# Patient Record
Sex: Female | Born: 1985 | Race: Black or African American | Hispanic: No | Marital: Single | Smoking: Former smoker
Health system: Southern US, Community
[De-identification: ages and names within clinical notes are randomized; demographics above are authoritative.]

## PROBLEM LIST (undated history)

## (undated) ENCOUNTER — Inpatient Hospital Stay (HOSPITAL_COMMUNITY): Payer: Self-pay

## (undated) DIAGNOSIS — B999 Unspecified infectious disease: Secondary | ICD-10-CM

## (undated) DIAGNOSIS — Z789 Other specified health status: Secondary | ICD-10-CM

## (undated) DIAGNOSIS — IMO0001 Reserved for inherently not codable concepts without codable children: Secondary | ICD-10-CM

## (undated) HISTORY — PX: NO PAST SURGERIES: SHX2092

---
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
## 2009-01-01 ENCOUNTER — Emergency Department (HOSPITAL_COMMUNITY): Admission: EM | Admit: 2009-01-01 | Discharge: 2009-01-01 | Payer: Self-pay | Admitting: Emergency Medicine

## 2010-01-06 ENCOUNTER — Inpatient Hospital Stay (HOSPITAL_COMMUNITY)
Admission: AD | Admit: 2010-01-06 | Discharge: 2010-01-06 | Payer: Self-pay | Source: Home / Self Care | Admitting: Obstetrics and Gynecology

## 2010-04-25 LAB — CBC
Hemoglobin: 11.5 g/dL — ABNORMAL LOW (ref 12.0–15.0)
MCHC: 33 g/dL (ref 30.0–36.0)
Platelets: 223 10*3/uL (ref 150–400)
RBC: 4.09 MIL/uL (ref 3.87–5.11)
RDW: 13.5 % (ref 11.5–15.5)

## 2010-04-25 LAB — OVA AND PARASITE EXAMINATION: Ova and parasites: NONE SEEN

## 2010-04-25 LAB — CLOSTRIDIUM DIFFICILE EIA: C difficile Toxins A+B, EIA: NEGATIVE

## 2010-04-25 LAB — COMPREHENSIVE METABOLIC PANEL
AST: 19 U/L (ref 0–37)
Alkaline Phosphatase: 39 U/L (ref 39–117)
CO2: 24 mEq/L (ref 19–32)
Chloride: 105 mEq/L (ref 96–112)
Creatinine, Ser: 0.48 mg/dL (ref 0.4–1.2)
GFR calc Af Amer: >60 mL/min (ref >60)
Potassium: 3.7 mEq/L (ref 3.5–5.1)
Sodium: 136 mEq/L (ref 135–145)

## 2010-04-25 LAB — URINALYSIS, ROUTINE W REFLEX MICROSCOPIC
Glucose, UA: NEGATIVE mg/dL
Ketones, ur: NEGATIVE mg/dL
Nitrite: NEGATIVE
Protein, ur: NEGATIVE mg/dL
pH: 7.5 (ref 5.0–8.0)

## 2010-04-25 LAB — STOOL CULTURE

## 2010-04-25 LAB — URINE MICROSCOPIC-ADD ON

## 2010-04-25 LAB — FECAL LACTOFERRIN, QUANT

## 2010-04-25 LAB — LIPASE, BLOOD: Lipase: 23 U/L (ref 11–59)

## 2010-06-22 ENCOUNTER — Inpatient Hospital Stay (HOSPITAL_COMMUNITY)
Admission: AD | Admit: 2010-06-22 | Discharge: 2010-06-25 | DRG: 775 | Disposition: A | Discharge status: Home / Self Care | Payer: Medicaid Other | Source: Ambulatory Visit | Attending: Obstetrics and Gynecology | Admitting: Obstetrics and Gynecology

## 2010-06-22 ENCOUNTER — Inpatient Hospital Stay (HOSPITAL_COMMUNITY)
Admission: AD | Admit: 2010-06-22 | Discharge: 2010-06-22 | Disposition: A | Discharge status: Home / Self Care | Payer: Medicaid Other | Source: Ambulatory Visit | Attending: Obstetrics and Gynecology | Admitting: Obstetrics and Gynecology

## 2010-06-22 DIAGNOSIS — D649 Anemia, unspecified: Secondary | ICD-10-CM | POA: Diagnosis present

## 2010-06-22 DIAGNOSIS — O479 False labor, unspecified: Secondary | ICD-10-CM | POA: Insufficient documentation

## 2010-06-22 DIAGNOSIS — O9902 Anemia complicating childbirth: Principal | ICD-10-CM | POA: Diagnosis present

## 2010-06-22 LAB — CBC
HCT: 39.9 % (ref 36.0–46.0)
Hemoglobin: 13.1 g/dL (ref 12.0–15.0)
MCH: 27.8 pg (ref 26.0–34.0)
MCV: 84.7 fL (ref 78.0–100.0)

## 2010-06-23 ENCOUNTER — Other Ambulatory Visit: Payer: Self-pay

## 2010-06-23 LAB — RPR: RPR Ser Ql: NONREACTIVE

## 2010-06-24 LAB — CBC
HCT: 32.1 % — ABNORMAL LOW (ref 36.0–46.0)
Hemoglobin: 10.2 g/dL — ABNORMAL LOW (ref 12.0–15.0)
MCH: 27.1 pg (ref 26.0–34.0)
RBC: 3.77 MIL/uL — ABNORMAL LOW (ref 3.87–5.11)
WBC: 11 10*3/uL — ABNORMAL HIGH (ref 4.0–10.5)

## 2011-05-12 ENCOUNTER — Encounter (HOSPITAL_COMMUNITY): Payer: Self-pay | Admitting: Emergency Medicine

## 2011-05-12 ENCOUNTER — Emergency Department (HOSPITAL_COMMUNITY): Payer: Self-pay

## 2011-05-12 ENCOUNTER — Emergency Department (HOSPITAL_COMMUNITY)
Admission: EM | Admit: 2011-05-12 | Discharge: 2011-05-12 | Disposition: A | Discharge status: Home / Self Care | Payer: Self-pay | Attending: Emergency Medicine | Admitting: Emergency Medicine

## 2011-05-12 ENCOUNTER — Other Ambulatory Visit: Payer: Self-pay

## 2011-05-12 DIAGNOSIS — R5381 Other malaise: Secondary | ICD-10-CM | POA: Insufficient documentation

## 2011-05-12 DIAGNOSIS — R079 Chest pain, unspecified: Secondary | ICD-10-CM | POA: Insufficient documentation

## 2011-05-12 DIAGNOSIS — R55 Syncope and collapse: Secondary | ICD-10-CM | POA: Insufficient documentation

## 2011-05-12 DIAGNOSIS — R5383 Other fatigue: Secondary | ICD-10-CM | POA: Insufficient documentation

## 2011-05-12 DIAGNOSIS — R059 Cough, unspecified: Secondary | ICD-10-CM | POA: Insufficient documentation

## 2011-05-12 DIAGNOSIS — F17200 Nicotine dependence, unspecified, uncomplicated: Secondary | ICD-10-CM | POA: Insufficient documentation

## 2011-05-12 DIAGNOSIS — R05 Cough: Secondary | ICD-10-CM | POA: Insufficient documentation

## 2011-05-12 DIAGNOSIS — J3489 Other specified disorders of nose and nasal sinuses: Secondary | ICD-10-CM | POA: Insufficient documentation

## 2011-05-12 HISTORY — DX: Reserved for inherently not codable concepts without codable children: IMO0001

## 2011-05-12 LAB — BASIC METABOLIC PANEL
BUN: 7 mg/dL (ref 6–23)
CO2: 28 mEq/L (ref 19–32)
Calcium: 9.1 mg/dL (ref 8.4–10.5)
Chloride: 103 mEq/L (ref 96–112)
Creatinine, Ser: 0.71 mg/dL (ref 0.50–1.10)
GFR calc Af Amer: >90 mL/min (ref >90)
GFR calc non Af Amer: >90 mL/min (ref >90)
Glucose, Bld: 116 mg/dL — ABNORMAL HIGH (ref 70–99)
Potassium: 3.8 mEq/L (ref 3.5–5.1)
Sodium: 139 mEq/L (ref 135–145)

## 2011-05-12 LAB — POCT I-STAT, CHEM 8
BUN: 6 mg/dL (ref 6–23)
Chloride: 103 mEq/L (ref 96–112)
Creatinine, Ser: 0.8 mg/dL (ref 0.50–1.10)
Hemoglobin: 12.9 g/dL (ref 12.0–15.0)
Potassium: 3.8 mEq/L (ref 3.5–5.1)
TCO2: 27 mmol/L (ref 0–100)

## 2011-05-12 LAB — CBC
HCT: 35.7 % — ABNORMAL LOW (ref 36.0–46.0)
MCHC: 32.5 g/dL (ref 30.0–36.0)
Platelets: 241 10*3/uL (ref 150–400)
RBC: 4.26 MIL/uL (ref 3.87–5.11)
WBC: 7.3 10*3/uL (ref 4.0–10.5)

## 2011-05-12 LAB — POCT I-STAT TROPONIN I: Troponin i, poc: 0.01 ng/mL (ref 0.00–0.08)

## 2011-05-12 NOTE — Discharge Instructions (Signed)
Chest Pain (Nonspecific) It is often hard to give a specific diagnosis for the cause of chest pain. There is always a chance that your pain could be related to something serious, such as a heart attack or a blood clot in the lungs. You need to follow up with your caregiver for further evaluation. CAUSES   Heartburn.   Pneumonia or bronchitis.   Anxiety or stress.   Inflammation around your heart (pericarditis) or lung (pleuritis or pleurisy).   A blood clot in the lung.   A collapsed lung (pneumothorax). It can develop suddenly on its own (spontaneous pneumothorax) or from injury (trauma) to the chest.   Shingles infection (herpes zoster virus).  The chest wall is composed of bones, muscles, and cartilage. Any of these can be the source of the pain.  The bones can be bruised by injury.   The muscles or cartilage can be strained by coughing or overwork.   The cartilage can be affected by inflammation and become sore (costochondritis).  DIAGNOSIS  Lab tests or other studies, such as X-rays, electrocardiography, stress testing, or cardiac imaging, may be needed to find the cause of your pain.  TREATMENT   Treatment depends on what may be causing your chest pain. Treatment may include:   Acid blockers for heartburn.   Anti-inflammatory medicine.   Pain medicine for inflammatory conditions.   Antibiotics if an infection is present.   You may be advised to change lifestyle habits. This includes stopping smoking and avoiding alcohol, caffeine, and chocolate.   You may be advised to keep your head raised (elevated) when sleeping. This reduces the chance of acid going backward from your stomach into your esophagus.   Most of the time, nonspecific chest pain will improve within 2 to 3 days with rest and mild pain medicine.  HOME CARE INSTRUCTIONS   If antibiotics were prescribed, take your antibiotics as directed. Finish them even if you start to feel better.   For the next few  days, avoid physical activities that bring on chest pain. Continue physical activities as directed.   Do not smoke.   Avoid drinking alcohol.   Only take over-the-counter or prescription medicine for pain, discomfort, or fever as directed by your caregiver.   Follow your caregiver's suggestions for further testing if your chest pain does not go away.   Keep any follow-up appointments you made. If you do not go to an appointment, you could develop lasting (chronic) problems with pain. If there is any problem keeping an appointment, you must call to reschedule.  SEEK MEDICAL CARE IF:   You think you are having problems from the medicine you are taking. Read your medicine instructions carefully.   Your chest pain does not go away, even after treatment.   You develop a rash with blisters on your chest.  SEEK IMMEDIATE MEDICAL CARE IF:   You have increased chest pain or pain that spreads to your arm, neck, jaw, back, or abdomen.   You develop shortness of breath, an increasing cough, or you are coughing up blood.   You have severe back or abdominal pain, feel nauseous, or vomit.   You develop severe weakness, fainting, or chills.   You have a fever.  THIS IS AN EMERGENCY. Do not wait to see if the pain will go away. Get medical help at once. Call your local emergency services (911 in [place] Do not drive yourself to the hospital. MAKE SURE YOU:   Understand these instructions.  Will watch your condition.   Will get help right away if you are not doing well or get worse.  Document Released: 11/08/2004 Document Revised: 01/18/2011 Document Reviewed: 09/04/2007 [facility] Patient Information 2012 [place], [place].

## 2011-05-12 NOTE — ED Notes (Signed)
EKG given to and signed by EDP. 

## 2011-05-12 NOTE — ED Notes (Addendum)
Patient reporting a "heavy feeling" in her chest and that her "heart feels funny" that started tonight when she was going to bed around 0330.  Location of chest heaviness on left side; rates pain 4/10.  Patient states that the heaviness gets worse when she lies down.  Patient states that she has been sick for the past few days (nasal congestion, runny nose, chills).  EKG done in triage.  Patient reports lightheadedness, but denies shortness of breath, nausea, vomiting, and blurred vision.

## 2011-05-12 NOTE — ED Provider Notes (Signed)
History     CSN: [id number]  Arrival date & time 05/12/11  4098   First MD Initiated Contact with Patient 05/12/11 [phone]      Chief Complaint  Patient presents with  . Chest Pain    (Consider location/radiation/quality/duration/timing/severity/associated sxs/prior treatment) Patient is a 26 y.o. female presenting with chest pain. The history is provided by the patient.  Chest Pain The chest pain began 1 - 2 hours ago. Chest pain occurs intermittently. The chest pain is unchanged. The severity of the pain is mild. The quality of the pain is described as sharp. The pain does not radiate. Pertinent negatives for primary symptoms include no fever, no shortness of breath, no cough, no nausea, no vomiting and no dizziness.  Pertinent negatives for associated symptoms include no diaphoresis and no lower extremity edema. She tried nothing for the symptoms. Risk factors include no known risk factors.  Pertinent negatives for past medical history include no arrhythmia and no CAD.     Past Medical History  Diagnosis Date  . No significant past medical history     History reviewed. No pertinent past surgical history.  History reviewed. No pertinent family history.  History  Substance Use Topics  . Smoking status: Current Everyday Smoker -- 0.5 packs/day  . Smokeless tobacco: Not on file  . Alcohol Use: Yes     Occassional Use    OB History    Grav Para Term Preterm Abortions TAB SAB Ect Mult Living                  Review of Systems  Constitutional: Negative for fever and diaphoresis.  Respiratory: Negative for cough and shortness of breath.   Cardiovascular: Positive for chest pain.  Gastrointestinal: Negative for nausea and vomiting.  Neurological: Negative for dizziness.  All other systems reviewed and are negative.    Allergies  Review of patient's allergies indicates no known allergies.  Home Medications  No current outpatient prescriptions on file.  BP 127/65   Pulse 84  Temp(Src) 98.1 F (36.7 C) (Oral)  Resp 18  SpO2 100%  LMP 04/12/2011  Physical Exam  Nursing note and vitals reviewed. Constitutional: She is oriented to person, place, and time. She appears well-developed and well-nourished.  Non-toxic appearance. No distress.  HENT:  Head: Normocephalic and atraumatic.  Eyes: Conjunctivae, EOM and lids are normal. Pupils are equal, round, and reactive to light.  Neck: Normal range of motion. Neck supple. No tracheal deviation present. No mass present.  Cardiovascular: Normal rate, regular rhythm and normal heart sounds.  Exam reveals no gallop.   No murmur heard. Pulmonary/Chest: Effort normal and breath sounds normal. No stridor. No respiratory distress. She has no decreased breath sounds. She has no wheezes. She has no rhonchi. She has no rales.  Abdominal: Soft. Normal appearance and bowel sounds are normal. She exhibits no distension. There is no tenderness. There is no rebound and no CVA tenderness.  Musculoskeletal: Normal range of motion. She exhibits no edema and no tenderness.  Neurological: She is alert and oriented to person, place, and time. She has normal strength. No cranial nerve deficit or sensory deficit. GCS eye subscore is 4. GCS verbal subscore is 5. GCS motor subscore is 6.  Skin: Skin is warm and dry. No abrasion and no rash noted.  Psychiatric: She has a normal mood and affect. Her speech is normal and behavior is normal.    ED Course  Procedures (including critical care time)  Labs Reviewed  CBC - Abnormal; Notable for the following:    Hemoglobin 11.6 (*)    HCT 35.7 (*)    All other components within normal limits  BASIC METABOLIC PANEL - Abnormal; Notable for the following:    Glucose, Bld 116 (*)    All other components within normal limits  POCT I-STAT, CHEM 8 - Abnormal; Notable for the following:    Glucose, Bld 111 (*)    All other components within normal limits  POCT I-STAT TROPONIN I   Dg Chest 2  View  05/12/2011  *RADIOLOGY REPORT*  Clinical Data: Left-sided chest pain.  Lethargy.  Cough.  Cold. Congestion.  Syncope.  CHEST - 2 VIEW  Comparison: None.  Findings: The heart size and pulmonary vascularity are normal. The lungs appear clear and expanded without focal air space disease or consolidation. No blunting of the costophrenic angles.  No pneumothorax  IMPRESSION: No evidence of active pulmonary disease.  Original Report Authenticated By: Marlon Pel, M.D.     No diagnosis found.    MDM   Date: 05/12/2011  Rate: 84  Rhythm: normal sinus rhythm  QRS Axis: normal  Intervals: normal  ST/T Wave abnormalities: normal  Conduction Disutrbances:none  Narrative Interpretation:   Old EKG Reviewed: none available  Pt with atypical cp, no concern for acs, perc neg, will d/c        Toy Baker, MD 05/12/11 [phone]

## 2011-10-07 ENCOUNTER — Emergency Department (HOSPITAL_COMMUNITY)
Admission: EM | Admit: 2011-10-07 | Discharge: 2011-10-07 | Disposition: A | Discharge status: Home / Self Care | Payer: Self-pay | Attending: Emergency Medicine | Admitting: Emergency Medicine

## 2011-10-07 ENCOUNTER — Encounter (HOSPITAL_COMMUNITY): Payer: Self-pay | Admitting: Emergency Medicine

## 2011-10-07 DIAGNOSIS — J029 Acute pharyngitis, unspecified: Secondary | ICD-10-CM | POA: Insufficient documentation

## 2011-10-07 DIAGNOSIS — F17200 Nicotine dependence, unspecified, uncomplicated: Secondary | ICD-10-CM | POA: Insufficient documentation

## 2011-10-07 LAB — RAPID STREP SCREEN (MED CTR MEBANE ONLY): Streptococcus, Group A Screen (Direct): NEGATIVE

## 2011-10-07 MED ORDER — AMOXICILLIN 500 MG PO CAPS: 500.0000 mg | ORAL_CAPSULE | Freq: Three times a day (TID) | ORAL | Status: AC

## 2011-10-07 MED ORDER — HYDROCODONE-ACETAMINOPHEN 7.5-500 MG/15ML PO SOLN: 15.0000 mL | Freq: Four times a day (QID) | ORAL | Status: AC | PRN

## 2011-10-07 MED ORDER — AMOXICILLIN 500 MG PO CAPS
500.0000 mg | ORAL_CAPSULE | Freq: Once | ORAL | Status: AC
  Administered 2011-10-07: 500 mg via ORAL
  Filled 2011-10-07: qty 1

## 2011-10-07 NOTE — ED Notes (Signed)
Pt here with sorethroat and fever x 2 days; white patches noted to troat

## 2011-10-07 NOTE — ED Provider Notes (Signed)
History  Scribed for Gwyneth Sprout, MD, the patient was seen in room TR06C/TR06C. This chart was scribed by Candelaria Stagers. The patient's care started at 2:37 PM   CSN: [id number]  Arrival date & time 10/07/11  1107   First MD Initiated Contact with Patient 10/07/11 1359      Chief Complaint  Patient presents with  . Sore Throat     The history is provided by the patient. No language interpreter was used.   Madison Davila is a 26 y.o. female who presents to the Emergency Department complaining of a sore throat that started two days ago.  She is also experiencing fever, cough, congestion, and rhinorrhea.  She has had ill contacts.  Nothing seems to make the sx better or worse.  Past Medical History  Diagnosis Date  . No significant past medical history     History reviewed. No pertinent past surgical history.  History reviewed. No pertinent family history.  History  Substance Use Topics  . Smoking status: Current Everyday Smoker -- 0.5 packs/day  . Smokeless tobacco: Not on file  . Alcohol Use: Yes     Occassional Use    OB History    Grav Para Term Preterm Abortions TAB SAB Ect Mult Living                  Review of Systems  Constitutional: Positive for fever.  HENT: Positive for congestion, sore throat and rhinorrhea. Negative for trouble swallowing.   Respiratory: Positive for cough.   All other systems reviewed and are negative.    Allergies  Review of patient's allergies indicates no known allergies.  Home Medications  No current outpatient prescriptions on file.  BP 128/72  Pulse 90  Temp 98.8 F (37.1 C) (Oral)  Resp 16  SpO2 98%  Physical Exam  Nursing note and vitals reviewed. Constitutional: She is oriented to person, place, and time. She appears well-developed and well-nourished. No distress.  HENT:  Head: Normocephalic and atraumatic.  Right Ear: External ear normal.  Left Ear: External ear normal.       Exudate on both tonsils.   Erythema and redness.    Eyes: EOM are normal. Pupils are equal, round, and reactive to light.  Neck: Normal range of motion.  Cardiovascular: Normal rate.   Pulmonary/Chest: Effort normal. No respiratory distress.  Abdominal: Soft. She exhibits no distension.  Musculoskeletal: Normal range of motion. She exhibits no edema.  Lymphadenopathy:    She has cervical adenopathy.  Neurological: She is alert and oriented to person, place, and time.  Skin: Skin is warm and dry.  Psychiatric: She has a normal mood and affect. Her behavior is normal.    ED Course  Procedures   DIAGNOSTIC STUDIES: Oxygen Saturation is 98% on room air, normal by my interpretation.    COORDINATION OF CARE:      Labs Reviewed  RAPID STREP SCREEN   No results found.   1. Pharyngitis       MDM   Patient with symptoms most consistent with strep throat. 4 of 4 Centor criteria and a large swollen exudative tonsils. Rapid strep was negative and a throat culture was sent. Patient was started on amoxicillin.  I personally performed the services described in this documentation, which was scribed in my presence.  The recorded information has been reviewed and considered.          Gwyneth Sprout, MD 10/07/11 1452

## 2012-07-16 ENCOUNTER — Emergency Department (HOSPITAL_COMMUNITY): Payer: Self-pay

## 2012-07-16 ENCOUNTER — Emergency Department (HOSPITAL_COMMUNITY)
Admission: EM | Admit: 2012-07-16 | Discharge: 2012-07-16 | Disposition: A | Discharge status: Home / Self Care | Payer: Self-pay | Attending: Emergency Medicine | Admitting: Emergency Medicine

## 2012-07-16 ENCOUNTER — Encounter (HOSPITAL_COMMUNITY): Payer: Self-pay | Admitting: Emergency Medicine

## 2012-07-16 DIAGNOSIS — Z3202 Encounter for pregnancy test, result negative: Secondary | ICD-10-CM | POA: Insufficient documentation

## 2012-07-16 DIAGNOSIS — R0789 Other chest pain: Secondary | ICD-10-CM | POA: Insufficient documentation

## 2012-07-16 DIAGNOSIS — R61 Generalized hyperhidrosis: Secondary | ICD-10-CM | POA: Insufficient documentation

## 2012-07-16 DIAGNOSIS — F17200 Nicotine dependence, unspecified, uncomplicated: Secondary | ICD-10-CM | POA: Insufficient documentation

## 2012-07-16 DIAGNOSIS — R42 Dizziness and giddiness: Secondary | ICD-10-CM | POA: Insufficient documentation

## 2012-07-16 DIAGNOSIS — R0602 Shortness of breath: Secondary | ICD-10-CM | POA: Insufficient documentation

## 2012-07-16 LAB — CBC WITH DIFFERENTIAL/PLATELET
Basophils Relative: 0 % (ref 0–1)
Eosinophils Absolute: 0.1 10*3/uL (ref 0.0–0.7)
Lymphs Abs: 2.5 10*3/uL (ref 0.7–4.0)
MCH: 26.7 pg (ref 26.0–34.0)
MCHC: 32.3 g/dL (ref 30.0–36.0)
Neutro Abs: 2.6 10*3/uL (ref 1.7–7.7)
Neutrophils Relative %: 47 % (ref 43–77)
Platelets: 274 10*3/uL (ref 150–400)
RBC: 4.2 MIL/uL (ref 3.87–5.11)

## 2012-07-16 LAB — POCT I-STAT, CHEM 8
Calcium, Ion: 1.27 mmol/L — ABNORMAL HIGH (ref 1.12–1.23)
Glucose, Bld: 86 mg/dL (ref 70–99)
HCT: 36 % (ref 36.0–46.0)
Hemoglobin: 12.2 g/dL (ref 12.0–15.0)

## 2012-07-16 LAB — POCT I-STAT TROPONIN I: Troponin i, poc: 0 ng/mL (ref 0.00–0.08)

## 2012-07-16 MED ORDER — ONDANSETRON 4 MG PO TBDP
ORAL_TABLET | ORAL | Status: AC
  Filled 2012-07-16: qty 2

## 2012-07-16 MED ORDER — ASPIRIN 81 MG PO CHEW
324.0000 mg | CHEWABLE_TABLET | Freq: Once | ORAL | Status: AC
  Administered 2012-07-16: 324 mg via ORAL
  Filled 2012-07-16: qty 4

## 2012-07-16 MED ORDER — GI COCKTAIL ~~LOC~~
30.0000 mL | Freq: Once | ORAL | Status: AC
  Administered 2012-07-16: 30 mL via ORAL
  Filled 2012-07-16: qty 30

## 2012-07-16 NOTE — ED Provider Notes (Signed)
Medical screening examination/treatment/procedure(s) were performed by non-physician practitioner and as supervising physician I was immediately available for consultation/collaboration.  Zophia Marrone K Alfhild Partch-Rasch, MD 07/16/12 0522 

## 2012-07-16 NOTE — ED Provider Notes (Signed)
History     CSN: [id number]  Arrival date & time 07/16/12  0156   First MD Initiated Contact with Patient 07/16/12 [phone]      Chief Complaint  Patient presents with  . Chest Pain   HPI  History provided by the patient. Patient is a 27 year old female with no significant PMH who presents with complaints of chest pains and discomforts. Patient reports having episodes of chest pains and discomforts for the past week or more. Her symptoms may come any time whether she is at rest or activity. They tend to come most frequently when she is laying down to rest and sleep. Pain is soreness and symptoms of squeezing ache mostly located in the sternal and central chest area. It is sometimes accompanied with shortness of breath symptoms. She does feel that she frequently will have rapid breathing prior to her symptoms. Patient states she does this she has had some significant stress but states she has no prior history of anxiety or panic attacks. She is not sure if this her symptoms or not. She has not used any medications for her symptoms. She does not take any birth control or estrogen. She denies any aggravating or alleviating factors. No associated hemoptysis, pleuritic pain, leg swelling or pain. No previous history of DVT or PE. No recent travel or immobility. No other complaints.     Past Medical History  Diagnosis Date  . No significant past medical history     History reviewed. No pertinent past surgical history.  No family history on file.  History  Substance Use Topics  . Smoking status: Current Every Day Smoker -- 0.50 packs/day  . Smokeless tobacco: Not on file  . Alcohol Use: Yes     Comment: Occassional Use    OB History   Grav Para Term Preterm Abortions TAB SAB Ect Mult Living                  Review of Systems  Constitutional: Positive for diaphoresis. Negative for fever, appetite change and unexpected weight change.  Respiratory: Positive for shortness of breath.    Cardiovascular: Positive for chest pain.  Gastrointestinal: Negative for nausea, vomiting and abdominal pain.  Endocrine: Positive for heat intolerance. Negative for polydipsia and polyuria.  Genitourinary: Negative for dysuria, frequency, hematuria and flank pain.  Neurological: Positive for light-headedness.  All other systems reviewed and are negative.    Allergies  Review of patient's allergies indicates no known allergies.  Home Medications   Current Outpatient Rx  Name  Route  Sig  Dispense  Refill  . calcium carbonate (OS-CAL) 1250 MG chewable tablet   Oral   Chew 1 tablet by mouth at bedtime as needed for heartburn.           BP 120/78  Pulse 61  Temp(Src) 98.1 F (36.7 C) (Oral)  Resp 21  SpO2 100%  LMP 07/10/2012  Physical Exam  Nursing note and vitals reviewed. Constitutional: She is oriented to person, place, and time. She appears well-developed and well-nourished. No distress.  HENT:  Head: Normocephalic.  Cardiovascular: Normal rate and regular rhythm.   Pulmonary/Chest: Effort normal and breath sounds normal. No respiratory distress. She has no wheezes. She has no rales. She exhibits tenderness.  Patient with some tenderness in the sternal area. No deformities.  Abdominal: Soft. There is no tenderness. There is no rebound and no guarding.  Musculoskeletal: Normal range of motion. She exhibits no edema and no tenderness.  No clinical signs  concerning for DVT  Neurological: She is alert and oriented to person, place, and time.  Skin: Skin is warm and dry. No rash noted.  Psychiatric: She has a normal mood and affect. Her behavior is normal.    ED Course  Procedures   Results for orders placed during the hospital encounter of 07/16/12  CBC WITH DIFFERENTIAL      Result Value Range   WBC 5.6  4.0 - 10.5 K/uL   RBC 4.20  3.87 - 5.11 MIL/uL   Hemoglobin 11.2 (*) 12.0 - 15.0 g/dL   HCT 16.1 (*) 09.6 - 04.5 %   MCV 82.6  78.0 - 100.0 fL   MCH 26.7   26.0 - 34.0 pg   MCHC 32.3  30.0 - 36.0 g/dL   RDW 40.9  81.1 - 91.4 %   Platelets 274  150 - 400 K/uL   Neutrophils Relative % 47  43 - 77 %   Neutro Abs 2.6  1.7 - 7.7 K/uL   Lymphocytes Relative 44  12 - 46 %   Lymphs Abs 2.5  0.7 - 4.0 K/uL   Monocytes Relative 8  3 - 12 %   Monocytes Absolute 0.4  0.1 - 1.0 K/uL   Eosinophils Relative 1  0 - 5 %   Eosinophils Absolute 0.1  0.0 - 0.7 K/uL   Basophils Relative 0  0 - 1 %   Basophils Absolute 0.0  0.0 - 0.1 K/uL  POCT I-STAT TROPONIN I      Result Value Range   Troponin i, poc 0.00  0.00 - 0.08 ng/mL   Comment 3           POCT PREGNANCY, URINE      Result Value Range   Preg Test, Ur NEGATIVE  NEGATIVE  POCT I-STAT, CHEM 8      Result Value Range   Sodium 140  135 - 145 mEq/L   Potassium 3.7  3.5 - 5.1 mEq/L   Chloride 103  96 - 112 mEq/L   BUN 7  6 - 23 mg/dL   Creatinine, Ser 7.82  0.50 - 1.10 mg/dL   Glucose, Bld 86  70 - 99 mg/dL   Calcium, Ion 9.56 (*) 1.12 - 1.23 mmol/L   TCO2 29  0 - 100 mmol/L   Hemoglobin 12.2  12.0 - 15.0 g/dL   HCT 21.3  08.6 - 57.8 %        Dg Chest 2 View  07/16/2012   *RADIOLOGY REPORT*  Clinical Data: Chest pain.  CHEST - 2 VIEW  Comparison: Chest x-ray 05/12/2011.  Findings: Lung volumes are normal.  No consolidative airspace disease.  No pleural effusions.  No pneumothorax.  No pulmonary nodule or mass noted.  Pulmonary vasculature and the cardiomediastinal silhouette are within normal limits.  IMPRESSION: 1. No radiographic evidence of acute cardiopulmonary disease.   Original Report Authenticated By: Trudie Reed, M.D.     1. Atypical chest pain       MDM  Patient seen and evaluated. Patient appears well in no acute distress. Currently denies any significant discomforts or pains. Patient is PERC negative.  Patient feeling better. She has atypical chest pain symptoms. No severe risk factors. Negative EKG, chest x-ray troponin and lab tests.      Date: 07/16/2012  Rate:  68  Rhythm: normal sinus rhythm  QRS Axis: normal  Intervals: normal  ST/T Wave abnormalities: normal  Conduction Disutrbances:none  Narrative Interpretation:   Old EKG  Reviewed: none available       Angus Seller, PA-C 07/16/12 0505

## 2012-07-16 NOTE — ED Notes (Signed)
PT. REPORTS INTERMITTENT MID CHEST TIGHTNESS / LEFT LATERAL CHEST PAIN WITH SLIGHT SOB /LIGHTHEADED FOR SEVERAL WEEKS , PT. STATED SHE IS UNSURE IF SHE HAS ANXIETY ATTACK , DENIES NAUSEA OR DIAPHORESIS. NO COUGH OR CONGESTION .

## 2013-01-22 LAB — OB RESULTS CONSOLE GBS: GBS: POSITIVE

## 2013-01-27 ENCOUNTER — Emergency Department (HOSPITAL_COMMUNITY)
Admission: EM | Admit: 2013-01-27 | Discharge: 2013-01-27 | Disposition: A | Discharge status: Home / Self Care | Payer: Medicaid Other | Attending: Emergency Medicine | Admitting: Emergency Medicine

## 2013-01-27 ENCOUNTER — Encounter (HOSPITAL_COMMUNITY): Payer: Self-pay | Admitting: Emergency Medicine

## 2013-01-27 DIAGNOSIS — R51 Headache: Secondary | ICD-10-CM | POA: Insufficient documentation

## 2013-01-27 DIAGNOSIS — F17200 Nicotine dependence, unspecified, uncomplicated: Secondary | ICD-10-CM | POA: Insufficient documentation

## 2013-01-27 MED ORDER — METOCLOPRAMIDE HCL 10 MG PO TABS
10.0000 mg | ORAL_TABLET | Freq: Once | ORAL | Status: AC
  Administered 2013-01-27: 10 mg via ORAL
  Filled 2013-01-27: qty 1

## 2013-01-27 MED ORDER — METOCLOPRAMIDE HCL 10 MG PO TABS: 10.0000 mg | ORAL_TABLET | Freq: Four times a day (QID) | ORAL | Status: DC | PRN

## 2013-01-27 MED ORDER — IBUPROFEN 600 MG PO TABS: 600.0000 mg | ORAL_TABLET | Freq: Four times a day (QID) | ORAL | Status: DC | PRN

## 2013-01-27 MED ORDER — KETOROLAC TROMETHAMINE 60 MG/2ML IM SOLN
60.0000 mg | Freq: Once | INTRAMUSCULAR | Status: AC
  Administered 2013-01-27: 60 mg via INTRAMUSCULAR
  Filled 2013-01-27: qty 2

## 2013-01-27 MED ORDER — TRAMADOL HCL 50 MG PO TABS
50.0000 mg | ORAL_TABLET | Freq: Once | ORAL | Status: AC
  Administered 2013-01-27: 50 mg via ORAL
  Filled 2013-01-27: qty 1

## 2013-01-27 MED ORDER — TRAMADOL HCL 50 MG PO TABS: 50.0000 mg | ORAL_TABLET | Freq: Four times a day (QID) | ORAL | Status: DC | PRN

## 2013-01-27 NOTE — ED Notes (Addendum)
Pt states that she had two mixed drinks and started to get headache. Pt took BC powder with no improvement. Denies n/v. States eyes are sensitive to light.

## 2013-01-27 NOTE — ED Notes (Signed)
Patient states she noticed having a slight headache earlier today, got off work went out and had 2 drinks.  When she went home she tried t lay down but it was too much pressure.  Took a BC earlier

## 2013-01-27 NOTE — ED Provider Notes (Signed)
CSN: [id number]     Arrival date & time 01/27/13  4098 History   First MD Initiated Contact with Patient 01/27/13 [phone]     Chief Complaint  Patient presents with  . Headache   (Consider location/radiation/quality/duration/timing/severity/associated sxs/prior Treatment) HPI Patient presents with gradual onset posterior for focal throbbing type headache. Again wrote her today and is slowly increased throughout the day. She has associated photophobia and nausea with it. She states she's had similar headaches in the past and that they normally improve with a BC however this headache did not. She has no focal weakness or numbness. She has no neck stiffness or pain. She's never been worked up for her headaches. She has no known inciting factors. Past Medical History  Diagnosis Date  . No significant past medical history    History reviewed. No pertinent past surgical history. History reviewed. No pertinent family history. History  Substance Use Topics  . Smoking status: Current Every Day Smoker -- 0.50 packs/day  . Smokeless tobacco: Not on file  . Alcohol Use: Yes     Comment: Occassional Use   OB History   Grav Para Term Preterm Abortions TAB SAB Ect Mult Living                 Review of Systems  Constitutional: Negative for fever and chills.  HENT: Negative for congestion, rhinorrhea, sinus pressure and sore throat.   Eyes: Positive for photophobia. Negative for visual disturbance.  Respiratory: Negative for chest tightness, shortness of breath and wheezing.   Cardiovascular: Negative for chest pain, palpitations and leg swelling.  Gastrointestinal: Positive for nausea. Negative for vomiting, abdominal pain, diarrhea and constipation.  Genitourinary: Negative for dysuria, frequency and flank pain.  Musculoskeletal: Negative for back pain, myalgias, neck pain and neck stiffness.  Skin: Negative for rash and wound.  Neurological: Positive for headaches. Negative for dizziness,  tremors, syncope, weakness, light-headedness and numbness.  All other systems reviewed and are negative.    Allergies  Review of patient's allergies indicates no known allergies.  Home Medications  No current outpatient prescriptions on file. BP 119/77  Pulse 65  Temp(Src) 98.1 F (36.7 C) (Oral)  Resp 20  Ht 5\' 7"  (1.702 m)  Wt 208 lb (94.348 kg)  BMI 32.57 kg/m2  SpO2 100%  LMP 01/27/2013 Physical Exam  Nursing note and vitals reviewed. Constitutional: She is oriented to person, place, and time. She appears well-developed and well-nourished. No distress.  Patient is lying in a dark room. She has light sensitivity  HENT:  Head: Normocephalic and atraumatic.  Mouth/Throat: Oropharynx is clear and moist.  No sinus tenderness to percussion.  Eyes: EOM are normal. Pupils are equal, round, and reactive to light.  Neck: Normal range of motion. Neck supple.  No meningismus  Cardiovascular: Normal rate and regular rhythm.   Pulmonary/Chest: Effort normal and breath sounds normal. No respiratory distress. She has no wheezes. She has no rales. She exhibits no tenderness.  Abdominal: Soft. Bowel sounds are normal. She exhibits no distension and no mass. There is no tenderness. There is no rebound and no guarding.  Musculoskeletal: Normal range of motion. She exhibits no edema and no tenderness.  Neurological: She is alert and oriented to person, place, and time.  Patient is alert and oriented x3 with clear, goal oriented speech. Patient has 5/5 motor in all extremities. Sensation is intact to light touch. Bilateral finger-to-nose is normal with no signs of dysmetria. Patient has a normal gait and walks without  assistance.   Skin: Skin is warm and dry. No rash noted. No erythema.  Psychiatric: She has a normal mood and affect. Her behavior is normal.    ED Course  Procedures (including critical care time) Labs Review Labs Reviewed - No data to display Imaging Review No results  found.  EKG Interpretation   None       MDM  Patient states she is feeling much better after the migraine cocktail. She displays no concerning signs for more serious intracranial pathology. I do not believe imaging is necessary at this point. Patient has been advised to followup with a neurologist for ongoing headaches. She's been given return precautions and voiced understanding.   Loren Racer, MD 01/27/13 [phone]

## 2013-07-18 ENCOUNTER — Inpatient Hospital Stay (HOSPITAL_COMMUNITY)
Admission: AD | Admit: 2013-07-18 | Discharge: 2013-07-18 | Disposition: A | Discharge status: Home / Self Care | Payer: Medicaid Other | Source: Ambulatory Visit | Attending: Obstetrics & Gynecology | Admitting: Obstetrics & Gynecology

## 2013-07-18 ENCOUNTER — Encounter (HOSPITAL_COMMUNITY): Payer: Self-pay | Admitting: *Deleted

## 2013-07-18 DIAGNOSIS — Z3201 Encounter for pregnancy test, result positive: Secondary | ICD-10-CM | POA: Insufficient documentation

## 2013-07-18 HISTORY — DX: Unspecified infectious disease: B99.9

## 2013-07-18 HISTORY — DX: Other specified health status: Z78.9

## 2013-07-18 NOTE — MAU Provider Note (Signed)
CC: Wants pregnancy verification S: Denies having any problems.  Will go to [facility] for pregnancy care. O:GENERAL: Well-developed, well-nourished female in no acute distress.  HEENT: Normocephalic, atraumatic.  LUNGS: Effort normal  HEART: Regular rate  SKIN: Warm, dry and without erythema  PSYCH: Normal mood and affect A:  Positive pregnancy test P: Your pregnancy test is positive.  No smoking, no drugs, no alcohol.  Take a prenatal vitamin one by mouth every day.  Eat small frequent snacks to avoid nausea.  Begin prenatal care as soon as possible.  Pregnancy verification letter given.

## 2013-07-18 NOTE — Discharge Instructions (Signed)
Your pregnancy test is positive.  No smoking, no drugs, no alcohol.  Take a prenatal vitamin one by mouth every day.  Eat small frequent snacks to avoid nausea.  Begin prenatal care as soon as possible. 

## 2013-07-18 NOTE — MAU Provider Note (Signed)
Attestation of Attending Supervision of Advanced Practitioner (CNM/NP): Evaluation and management procedures were performed by the Advanced Practitioner under my supervision and collaboration. I have reviewed the Advanced Practitioner's note and chart, and I agree with the management and plan.  Lesly Dukes 7:34 PM

## 2013-07-18 NOTE — MAU Note (Signed)
"  I know I am pregnant'. +HPT ~1.5wks ago.  Needs confirmation, plans to apply for medicaid on Monday.   Denies any problems

## 2013-07-20 LAB — POCT PREGNANCY, URINE: PREG TEST UR: POSITIVE — AB

## 2013-08-03 ENCOUNTER — Encounter (HOSPITAL_COMMUNITY): Payer: Self-pay

## 2013-08-25 LAB — OB RESULTS CONSOLE ABO/RH: RH TYPE: POSITIVE

## 2013-08-25 LAB — OB RESULTS CONSOLE ANTIBODY SCREEN: Antibody Screen: NEGATIVE

## 2013-08-25 LAB — OB RESULTS CONSOLE GC/CHLAMYDIA
Chlamydia: NEGATIVE
Gonorrhea: NEGATIVE

## 2013-08-25 LAB — OB RESULTS CONSOLE RPR: RPR: NONREACTIVE

## 2013-08-25 LAB — OB RESULTS CONSOLE HIV ANTIBODY (ROUTINE TESTING): HIV: NONREACTIVE

## 2013-08-25 LAB — OB RESULTS CONSOLE HEPATITIS B SURFACE ANTIGEN: Hepatitis B Surface Ag: NEGATIVE

## 2013-08-25 LAB — OB RESULTS CONSOLE RUBELLA ANTIBODY, IGM: Rubella: IMMUNE

## 2013-12-14 ENCOUNTER — Encounter (HOSPITAL_COMMUNITY): Payer: Self-pay

## 2014-01-17 ENCOUNTER — Encounter (HOSPITAL_COMMUNITY): Payer: Self-pay | Admitting: *Deleted

## 2014-01-17 ENCOUNTER — Inpatient Hospital Stay (HOSPITAL_COMMUNITY)
Admission: AD | Admit: 2014-01-17 | Discharge: 2014-01-17 | Disposition: A | Discharge status: Home / Self Care | Payer: Medicaid Other | Source: Ambulatory Visit | Attending: Obstetrics & Gynecology | Admitting: Obstetrics & Gynecology

## 2014-01-17 DIAGNOSIS — B9689 Other specified bacterial agents as the cause of diseases classified elsewhere: Secondary | ICD-10-CM

## 2014-01-17 DIAGNOSIS — Z3A35 35 weeks gestation of pregnancy: Secondary | ICD-10-CM | POA: Insufficient documentation

## 2014-01-17 DIAGNOSIS — Z87891 Personal history of nicotine dependence: Secondary | ICD-10-CM | POA: Diagnosis not present

## 2014-01-17 DIAGNOSIS — N949 Unspecified condition associated with female genital organs and menstrual cycle: Secondary | ICD-10-CM | POA: Insufficient documentation

## 2014-01-17 DIAGNOSIS — N760 Acute vaginitis: Secondary | ICD-10-CM

## 2014-01-17 DIAGNOSIS — O9989 Other specified diseases and conditions complicating pregnancy, childbirth and the puerperium: Secondary | ICD-10-CM | POA: Insufficient documentation

## 2014-01-17 LAB — WET PREP, GENITAL
Trich, Wet Prep: NONE SEEN
YEAST WET PREP: NONE SEEN

## 2014-01-17 LAB — URINALYSIS, ROUTINE W REFLEX MICROSCOPIC
Bilirubin Urine: NEGATIVE
GLUCOSE, UA: NEGATIVE mg/dL
Hgb urine dipstick: NEGATIVE
Ketones, ur: NEGATIVE mg/dL
LEUKOCYTES UA: NEGATIVE
NITRITE: NEGATIVE
PH: 6 (ref 5.0–8.0)
Protein, ur: NEGATIVE mg/dL
SPECIFIC GRAVITY, URINE: 1.015 (ref 1.005–1.030)
Urobilinogen, UA: 0.2 mg/dL (ref 0.0–1.0)

## 2014-01-17 MED ORDER — METRONIDAZOLE 500 MG PO TABS: 500.0000 mg | ORAL_TABLET | Freq: Two times a day (BID) | ORAL | Status: DC

## 2014-01-17 NOTE — Progress Notes (Signed)
Wet prep only sent

## 2014-01-17 NOTE — Discharge Instructions (Signed)
Bacterial Vaginosis Bacterial vaginosis is a vaginal infection that occurs when the normal balance of bacteria in the vagina is disrupted. It results from an overgrowth of certain bacteria. This is the most common vaginal infection in women of childbearing age. Treatment is important to prevent complications, especially in pregnant women, as it can cause a premature delivery. CAUSES  Bacterial vaginosis is caused by an increase in harmful bacteria that are normally present in smaller amounts in the vagina. Several different kinds of bacteria can cause bacterial vaginosis. However, the reason that the condition develops is not fully understood. RISK FACTORS Certain activities or behaviors can put you at an increased risk of developing bacterial vaginosis, including:  Having a new sex partner or multiple sex partners.  Douching.  Using an intrauterine device (IUD) for contraception. Women do not get bacterial vaginosis from toilet seats, bedding, swimming pools, or contact with objects around them. SIGNS AND SYMPTOMS  Some women with bacterial vaginosis have no signs or symptoms. Common symptoms include:  Grey vaginal discharge.  A fishlike odor with discharge, especially after sexual intercourse.  Itching or burning of the vagina and vulva.  Burning or pain with urination. DIAGNOSIS  Your health care provider will take a medical history and examine the vagina for signs of bacterial vaginosis. A sample of vaginal fluid may be taken. Your health care provider will look at this sample under a microscope to check for bacteria and abnormal cells. A vaginal pH test may also be done.  TREATMENT  Bacterial vaginosis may be treated with antibiotic medicines. These may be given in the form of a pill or a vaginal cream. A second round of antibiotics may be prescribed if the condition comes back after treatment.  HOME CARE INSTRUCTIONS   Only take over-the-counter or prescription medicines as  directed by your health care provider.  If antibiotic medicine was prescribed, take it as directed. Make sure you finish it even if you start to feel better.  Do not have sex until treatment is completed.  Tell all sexual partners that you have a vaginal infection. They should see their health care provider and be treated if they have problems, such as a mild rash or itching.  Practice safe sex by using condoms and only having one sex partner. SEEK MEDICAL CARE IF:   Your symptoms are not improving after 3 days of treatment.  You have increased discharge or pain.  You have a fever. MAKE SURE YOU:   Understand these instructions.  Will watch your condition.  Will get help right away if you are not doing well or get worse. FOR MORE INFORMATION  Centers for Disease Control and Prevention, Division of STD Prevention: SolutionApps.co.zawww.cdc.gov/std American Sexual Health Association (ASHA): www.ashastd.org  Document Released: 01/29/2005 Document Revised: 11/19/2012 Document Reviewed: 09/10/2012 [facility], [facility], [facility] Patient Information 2015 [place]ExitCare, [place]. This information is not intended to replace advice given to you by your health care provider. Make sure you discuss any questions you have with your health care provider. Third Trimester of Pregnancy The third trimester is from week 29 through week 42, months 7 through 9. The third trimester is a time when the fetus is growing rapidly. At the end of the ninth month, the fetus is about 20 inches in length and weighs 6-10 pounds.  BODY CHANGES Your body goes through many changes during pregnancy. The changes vary from woman to woman.   Your weight will continue to increase. You can expect to gain 25-35 pounds (11-16 kg) by  the end of the pregnancy.  You may begin to get stretch marks on your hips, abdomen, and breasts.  You may urinate more often because the fetus is moving lower into your pelvis and pressing on your bladder.  You may develop or continue to  have heartburn as a result of your pregnancy.  You may develop constipation because certain hormones are causing the muscles that push waste through your intestines to slow down.  You may develop hemorrhoids or swollen, bulging veins (varicose veins).  You may have pelvic pain because of the weight gain and pregnancy hormones relaxing your joints between the bones in your pelvis. Backaches may result from overexertion of the muscles supporting your posture.  You may have changes in your hair. These can include thickening of your hair, rapid growth, and changes in texture. Some women also have hair loss during or after pregnancy, or hair that feels dry or thin. Your hair will most likely return to normal after your baby is born.  Your breasts will continue to grow and be tender. A yellow discharge may leak from your breasts called colostrum.  Your belly button may stick out.  You may feel short of breath because of your expanding uterus.  You may notice the fetus "dropping," or moving lower in your abdomen.  You may have a bloody mucus discharge. This usually occurs a few days to a week before labor begins.  Your cervix becomes thin and soft (effaced) near your due date. WHAT TO EXPECT AT YOUR PRENATAL EXAMS  You will have prenatal exams every 2 weeks until week 36. Then, you will have weekly prenatal exams. During a routine prenatal visit:  You will be weighed to make sure you and the fetus are growing normally.  Your blood pressure is taken.  Your abdomen will be measured to track your baby's growth.  The fetal heartbeat will be listened to.  Any test results from the previous visit will be discussed.  You may have a cervical check near your due date to see if you have effaced. At around 36 weeks, your caregiver will check your cervix. At the same time, your caregiver will also perform a test on the secretions of the vaginal tissue. This test is to determine if a type of bacteria,  Group B streptococcus, is present. Your caregiver will explain this further. Your caregiver may ask you:  What your birth plan is.  How you are feeling.  If you are feeling the baby move.  If you have had any abnormal symptoms, such as leaking fluid, bleeding, severe headaches, or abdominal cramping.  If you have any questions. Other tests or screenings that may be performed during your third trimester include:  Blood tests that check for low iron levels (anemia).  Fetal testing to check the health, activity level, and growth of the fetus. Testing is done if you have certain medical conditions or if there are problems during the pregnancy. FALSE LABOR You may feel small, irregular contractions that eventually go away. These are called Braxton Hicks contractions, or false labor. Contractions may last for hours, days, or even weeks before true labor sets in. If contractions come at regular intervals, intensify, or become painful, it is best to be seen by your caregiver.  SIGNS OF LABOR   Menstrual-like cramps.  Contractions that are 5 minutes apart or less.  Contractions that start on the top of the uterus and spread down to the lower abdomen and back.  A sense  of increased pelvic pressure or back pain.  A watery or bloody mucus discharge that comes from the vagina. If you have any of these signs before the 37th week of pregnancy, call your caregiver right away. You need to go to the hospital to get checked immediately. HOME CARE INSTRUCTIONS   Avoid all smoking, herbs, alcohol, and unprescribed drugs. These chemicals affect the formation and growth of the baby.  Follow your caregiver's instructions regarding medicine use. There are medicines that are either safe or unsafe to take during pregnancy.  Exercise only as directed by your caregiver. Experiencing uterine cramps is a good sign to stop exercising.  Continue to eat regular, healthy meals.  Wear a good support bra for  breast tenderness.  Do not use hot tubs, steam rooms, or saunas.  Wear your seat belt at all times when driving.  Avoid raw meat, uncooked cheese, cat litter boxes, and soil used by cats. These carry germs that can cause birth defects in the baby.  Take your prenatal vitamins.  Try taking a stool softener (if your caregiver approves) if you develop constipation. Eat more high-fiber foods, such as fresh vegetables or fruit and whole grains. Drink plenty of fluids to keep your urine clear or pale yellow.  Take warm sitz baths to soothe any pain or discomfort caused by hemorrhoids. Use hemorrhoid cream if your caregiver approves.  If you develop varicose veins, wear support hose. Elevate your feet for 15 minutes, 3-4 times a day. Limit salt in your diet.  Avoid heavy lifting, wear low heal shoes, and practice good posture.  Rest a lot with your legs elevated if you have leg cramps or low back pain.  Visit your dentist if you have not gone during your pregnancy. Use a soft toothbrush to brush your teeth and be gentle when you floss.  A sexual relationship may be continued unless your caregiver directs you otherwise.  Do not travel far distances unless it is absolutely necessary and only with the approval of your caregiver.  Take prenatal classes to understand, practice, and ask questions about the labor and delivery.  Make a trial run to the hospital.  Pack your hospital bag.  Prepare the baby's nursery.  Continue to go to all your prenatal visits as directed by your caregiver. SEEK MEDICAL CARE IF:  You are unsure if you are in labor or if your water has broken.  You have dizziness.  You have mild pelvic cramps, pelvic pressure, or nagging pain in your abdominal area.  You have persistent nausea, vomiting, or diarrhea.  You have a bad smelling vaginal discharge.  You have pain with urination. SEEK IMMEDIATE MEDICAL CARE IF:   You have a fever.  You are leaking fluid  from your vagina.  You have spotting or bleeding from your vagina.  You have severe abdominal cramping or pain.  You have rapid weight loss or gain.  You have shortness of breath with chest pain.  You notice sudden or extreme swelling of your face, hands, ankles, feet, or legs.  You have not felt your baby move in over an hour.  You have severe headaches that do not go away with medicine.  You have vision changes. Document Released: 01/23/2001 Document Revised: 02/03/2013 Document Reviewed: 04/01/2012 [facility] Patient Information 2015 [place]ExitCare, MarylandLLC. This information is not intended to replace advice given to you by your health care provider. Make sure you discuss any questions you have with your health care provider.

## 2014-01-17 NOTE — MAU Note (Signed)
Having a lot of pelvic pressure, esp when i stand. I think i may be having contractions. Denies bleeding or LOF

## 2014-01-17 NOTE — Progress Notes (Signed)
Madison Davila CNM 

## 2014-01-17 NOTE — Progress Notes (Signed)
Gerrit HeckJessica Emly CNM notified of pt's admission and status. Aware of u/a results. Will see pt

## 2014-01-17 NOTE — MAU Provider Note (Signed)
History    Madison LopesYasmine M Davila is a 28 y.o. G2P1001 at 35.4wks who presents, unannounced, for vaginal pressure.  Patient states pressure started about 9pm this evening and has gradually worsened.  Patient reports contractions and although she admits they are mild, they are "not like the braxton hicks contractions."  Patient denies LOF, VB, and reports active fetus.  Patient denies issues with urination, constipation/diarrhea, and sexual intercourse in the past 48 hours.  Patient with no other complaints at current.    There are no active problems to display for this patient.   Chief Complaint  Patient presents with  . Pelvic Pain  . Contractions   HPI  OB History    Gravida Para Term Preterm AB TAB SAB Ectopic Multiple Living   2 1 1  0 0 0 0 0 0 1      Past Medical History  Diagnosis Date  . No significant past medical history   . Infection     UTI  . Medical history non-contributory     Past Surgical History  Procedure Laterality Date  . No past surgeries      Family History  Problem Relation Age of Onset  . Hypertension Mother   . Hypertension Father   . Cancer Paternal Grandmother     History  Substance Use Topics  . Smoking status: Former Smoker -- 0.50 packs/day    Types: Cigarettes  . Smokeless tobacco: Never Used     Comment: Feb 2015  . Alcohol Use: No     Comment: Occassional Use    Allergies: No Known Allergies  Prescriptions prior to admission  Medication Sig Dispense Refill Last Dose  . Prenatal Vit-Fe Fumarate-FA (PRENATAL MULTIVITAMIN) TABS tablet Take 1 tablet by mouth daily at 12 noon.   Past Week at Unknown time  . metoCLOPramide (REGLAN) 10 MG tablet Take 1 tablet (10 mg total) by mouth every 6 (six) hours as needed for nausea (nausea/headache). 6 tablet 0     ROS  See HPI Above Physical Exam   Blood pressure 136/74, pulse 76, temperature 97.9 F (36.6 C), height 5\' 6"  (1.676 m), weight 254 lb (115.214 kg), last menstrual period  05/13/2013.  Results for orders placed or performed during the hospital encounter of 01/17/14 (from the past 24 hour(s))  Urinalysis, Routine w reflex microscopic     Status: None   Collection Time: 01/17/14  1:50 AM  Result Value Ref Range   Color, Urine YELLOW YELLOW   APPearance CLEAR CLEAR   Specific Gravity, Urine 1.015 1.005 - 1.030   pH 6.0 5.0 - 8.0   Glucose, UA NEGATIVE NEGATIVE mg/dL   Hgb urine dipstick NEGATIVE NEGATIVE   Bilirubin Urine NEGATIVE NEGATIVE   Ketones, ur NEGATIVE NEGATIVE mg/dL   Protein, ur NEGATIVE NEGATIVE mg/dL   Urobilinogen, UA 0.2 0.0 - 1.0 mg/dL   Nitrite NEGATIVE NEGATIVE   Leukocytes, UA NEGATIVE NEGATIVE  Wet prep, genital     Status: Abnormal   Collection Time: 01/17/14  3:12 AM  Result Value Ref Range   Yeast Wet Prep HPF POC NONE SEEN NONE SEEN   Trich, Wet Prep NONE SEEN NONE SEEN   Clue Cells Wet Prep HPF POC FEW (A) NONE SEEN   WBC, Wet Prep HPF POC MODERATE (A) NONE SEEN    Physical Exam  Constitutional: She is oriented to person, place, and time. She appears well-developed and well-nourished. No distress.  Cardiovascular: Normal rate, regular rhythm and normal heart sounds.  Respiratory: Effort normal and breath sounds normal.  GI: Soft. Bowel sounds are normal.  Appears gravid--fundal height AGA, Soft, NT   Genitourinary:  Sterile Speculum Exam: -Vaginal Vault: No discharged noted throughout---thin whitish green discharge noted near cervix and anterior fornix-wet prep collected -Cervix: Mucoid discharge  from os  Bimanual Exam: Closed/Long/Thick/Ballotable   Musculoskeletal: Normal range of motion. She exhibits no edema.  Neurological: She is alert and oriented to person, place, and time.  Skin: Skin is warm and dry.   FHR: 125 bpm, Mod Var, -Decels, +Accels UC: Q1-623min, palpates mild ED Course  Assessment: IUP at 35.4wks Cat I FT Vaginal Pressure  Plan: -UA: WNL -PE as above -Wet prep Pending  Follow Up  ([id number]) -Wet prep: +Clue Cells -Rx sent for flagyl 500mg  BID x 7days -Keep appt as scheduled:  01/22/2014 -Encouraged to call if any questions or concerns arise prior to next scheduled office visit.  -Labor and Bleeding Precautions  -Discharged to home in stable condition  Fernandez Kenley LYNN CNM, MSN 01/17/2014 2:32 AM

## 2014-01-17 NOTE — Progress Notes (Signed)
Jessica EMly CNM in earlier to discuss test results and d/c plan. Written and verbal d/c instructions given and understanding voiced. 

## 2014-01-22 LAB — OB RESULTS CONSOLE GBS: GBS: POSITIVE

## 2014-02-12 ENCOUNTER — Encounter (HOSPITAL_COMMUNITY): Payer: Self-pay | Admitting: *Deleted

## 2014-02-12 ENCOUNTER — Inpatient Hospital Stay (HOSPITAL_COMMUNITY)
Admission: AD | Admit: 2014-02-12 | Discharge: 2014-02-12 | Disposition: A | Discharge status: Home / Self Care | Payer: Medicaid Other | Source: Ambulatory Visit | Attending: Obstetrics & Gynecology | Admitting: Obstetrics & Gynecology

## 2014-02-12 DIAGNOSIS — O99213 Obesity complicating pregnancy, third trimester: Secondary | ICD-10-CM | POA: Insufficient documentation

## 2014-02-12 DIAGNOSIS — O0933 Supervision of pregnancy with insufficient antenatal care, third trimester: Secondary | ICD-10-CM | POA: Diagnosis not present

## 2014-02-12 DIAGNOSIS — B951 Streptococcus, group B, as the cause of diseases classified elsewhere: Secondary | ICD-10-CM | POA: Diagnosis present

## 2014-02-12 DIAGNOSIS — O99820 Streptococcus B carrier state complicating pregnancy: Secondary | ICD-10-CM | POA: Insufficient documentation

## 2014-02-12 DIAGNOSIS — Z87891 Personal history of nicotine dependence: Secondary | ICD-10-CM | POA: Diagnosis not present

## 2014-02-12 DIAGNOSIS — Z3A39 39 weeks gestation of pregnancy: Secondary | ICD-10-CM | POA: Diagnosis not present

## 2014-02-12 DIAGNOSIS — Z6841 Body Mass Index (BMI) 40.0 and over, adult: Secondary | ICD-10-CM | POA: Insufficient documentation

## 2014-02-12 DIAGNOSIS — O0930 Supervision of pregnancy with insufficient antenatal care, unspecified trimester: Secondary | ICD-10-CM

## 2014-02-12 DIAGNOSIS — O368130 Decreased fetal movements, third trimester, not applicable or unspecified: Secondary | ICD-10-CM | POA: Diagnosis present

## 2014-02-12 NOTE — L&D Delivery Note (Signed)
Delivery Note At 1:24 PM a viable female was delivered via Vaginal, Spontaneous Delivery (Presentation: Left Occiput Anterior).  APGAR: 9, 9; weight  .   Placenta status: Intact, Spontaneous.  Cord: 3 vessels with the following complications: None.  Cord pH: n/a  Anesthesia: Epidural  Episiotomy: None Lacerations: 1st degree;Periurethral Suture Repair:4-0 vicryl Est. Blood Loss (mL):  300cc  Mom to postpartum.  Baby to Couplet care / Skin to Skin.  Kaitlyn Skowron Y 02/18/2014, 1:48 PM

## 2014-02-12 NOTE — MAU Note (Signed)
Patient states she has been having irregular contractions for a while. Was walking in the mall and started having sharp pains on the side of the abdomen. Has felt fetal movement but not as much as usual. Denies bleeding or leaking.

## 2014-02-12 NOTE — Discharge Instructions (Signed)
Braxton Hicks Contractions °Contractions of the uterus can occur throughout pregnancy. Contractions are not always a sign that you are in labor.  °WHAT ARE BRAXTON HICKS CONTRACTIONS?  °Contractions that occur before labor are called Braxton Hicks contractions, or false labor. Toward the end of pregnancy (32-34 weeks), these contractions can develop more often and may become more forceful. This is not true labor because these contractions do not result in opening (dilatation) and thinning of the cervix. They are sometimes difficult to tell apart from true labor because these contractions can be forceful and people have different pain tolerances. You should not feel embarrassed if you go to the hospital with false labor. Sometimes, the only way to tell if you are in true labor is for your health care provider to look for changes in the cervix. °If there are no prenatal problems or other health problems associated with the pregnancy, it is completely safe to be sent home with false labor and await the onset of true labor. °HOW CAN YOU TELL THE DIFFERENCE BETWEEN TRUE AND FALSE LABOR? °False Labor °· The contractions of false labor are usually shorter and not as hard as those of true labor.   °· The contractions are usually irregular.   °· The contractions are often felt in the front of the lower abdomen and in the groin.   °· The contractions may go away when you walk around or change positions while lying down.   °· The contractions get weaker and are shorter lasting as time goes on.   °· The contractions do not usually become progressively stronger, regular, and closer together as with true labor.   °True Labor °· Contractions in true labor last 30-70 seconds, become very regular, usually become more intense, and increase in frequency.   °· The contractions do not go away with walking.   °· The discomfort is usually felt in the top of the uterus and spreads to the lower abdomen and low back.   °· True labor can be  determined by your health care provider with an exam. This will show that the cervix is dilating and getting thinner.   °WHAT TO REMEMBER °· Keep up with your usual exercises and follow other instructions given by your health care provider.   °· Take medicines as directed by your health care provider.   °· Keep your regular prenatal appointments.   °· Eat and drink lightly if you think you are going into labor.   °· If Braxton Hicks contractions are making you uncomfortable:   °¨ Change your position from lying down or resting to walking, or from walking to resting.   °¨ Sit and rest in a tub of warm water.   °¨ Drink 2-3 glasses of water. Dehydration may cause these contractions.   °¨ Do slow and deep breathing several times an hour.   °WHEN SHOULD I SEEK IMMEDIATE MEDICAL CARE? °Seek immediate medical care if: °· Your contractions become stronger, more regular, and closer together.   °· You have fluid leaking or gushing from your vagina.   °· You have a fever.   °· You pass blood-tinged mucus.   °· You have vaginal bleeding.   °· You have continuous abdominal pain.   °· You have low back pain that you never had before.   °· You feel your baby's head pushing down and causing pelvic pressure.   °· Your baby is not moving as much as it used to.   °Document Released: 01/29/2005 Document Revised: 02/03/2013 Document Reviewed: 11/10/2012 °ExitCare® Patient Information ©2015 ExitCare, LLC. This information is not intended to replace advice given to you by your health care   provider. Make sure you discuss any questions you have with your health care provider. ° °

## 2014-02-12 NOTE — MAU Note (Signed)
Urine in lab 

## 2014-02-12 NOTE — MAU Provider Note (Signed)
History   29 yo G2P1001 at 31 2/7 weeks presented unannounced c/o ? Decreased FM this afternoon and sharp cramps in sides while walking at the mall.  Denies leaking or bleeding, now reporting FM.  No dysuria.  Patient Active Problem List   Diagnosis Date Noted  . Decreased fetal movement 02/12/2014  . Morbid obesity 02/12/2014  . Late prenatal care--at 15 weeks 02/12/2014  . Positive GBS test 02/12/2014    Chief Complaint  Patient presents with  . Labor Eval  . Decreased Fetal Movement   HPI:  As above  OB History    Gravida Para Term Preterm AB TAB SAB Ectopic Multiple Living   0 0 0 0 0 0 1      Past Medical History  Diagnosis Date  . No significant past medical history   . Infection     UTI  . Medical history non-contributory     Past Surgical History  Procedure Laterality Date  . No past surgeries      Family History  Problem Relation Age of Onset  . Hypertension Mother   . Hypertension Father   . Cancer Paternal Grandmother     History  Substance Use Topics  . Smoking status: Former Smoker -- 0.50 packs/day    Types: Cigarettes  . Smokeless tobacco: Never Used     Comment: Feb 2015  . Alcohol Use: No     Comment: Occassional Use    Allergies: No Known Allergies  Prescriptions prior to admission  Medication Sig Dispense Refill Last Dose  . Prenatal Vit-Fe Fumarate-FA (PRENATAL MULTIVITAMIN) TABS tablet Take 1 tablet by mouth daily at 12 noon.   two weeks  . metroNIDAZOLE (FLAGYL) 500 MG tablet Take 1 tablet (500 mg total) by mouth 2 (two) times daily. (Patient not taking: Reported on 02/12/2014) 14 tablet 0 Completed Course at Unknown time    ROS:  Mild cramping, +FM now Physical Exam   Blood pressure 136/83, pulse 85, temperature 98.7 F (37.1 C), temperature source Oral, resp. rate 18, height  (1.651 m), weight 259 lb 6.4 oz (117.663 kg), last menstrual period 05/13/2013.  Physical Exam  In NAD Chest clear Heart RRR without  murmur Abd gravid, NT Pelvic--posterior, soft, external os 2 cm, internal os FT-1 cm, thick, vtx, -3 Ext WNL  FHR Category 1 UCs very occasional, mild  ED Course  Assessment: IUP at 39 2/7 weeks Reassuring fetal status No evidence labor  Plan: S/s of labor reviewed, with FKCs discussed. F/u as scheduled at [facility] next week, or return prn.   Nigel Bridgeman CNM, MSN 02/12/2014 6:02 PM

## 2014-02-17 ENCOUNTER — Encounter (HOSPITAL_COMMUNITY): Payer: Self-pay

## 2014-02-17 ENCOUNTER — Inpatient Hospital Stay (HOSPITAL_COMMUNITY)
Admission: AD | Admit: 2014-02-17 | Discharge: 2014-02-17 | Disposition: A | Discharge status: Home / Self Care | Payer: Medicaid Other | Source: Ambulatory Visit | Attending: Obstetrics & Gynecology | Admitting: Obstetrics & Gynecology

## 2014-02-17 DIAGNOSIS — Z3A40 40 weeks gestation of pregnancy: Secondary | ICD-10-CM | POA: Insufficient documentation

## 2014-02-17 DIAGNOSIS — O0933 Supervision of pregnancy with insufficient antenatal care, third trimester: Secondary | ICD-10-CM | POA: Diagnosis not present

## 2014-02-17 DIAGNOSIS — Z87891 Personal history of nicotine dependence: Secondary | ICD-10-CM | POA: Insufficient documentation

## 2014-02-17 DIAGNOSIS — O99820 Streptococcus B carrier state complicating pregnancy: Secondary | ICD-10-CM | POA: Diagnosis not present

## 2014-02-17 DIAGNOSIS — O471 False labor at or after 37 completed weeks of gestation: Secondary | ICD-10-CM | POA: Diagnosis not present

## 2014-02-17 DIAGNOSIS — O368190 Decreased fetal movements, unspecified trimester, not applicable or unspecified: Secondary | ICD-10-CM | POA: Insufficient documentation

## 2014-02-17 DIAGNOSIS — Z6841 Body Mass Index (BMI) 40.0 and over, adult: Secondary | ICD-10-CM | POA: Diagnosis not present

## 2014-02-17 DIAGNOSIS — O99213 Obesity complicating pregnancy, third trimester: Secondary | ICD-10-CM | POA: Insufficient documentation

## 2014-02-17 NOTE — MAU Note (Signed)
Pt reports contractions x 2 hours, denies bleeding or ROM. Reports good fetal movement.  

## 2014-02-17 NOTE — Discharge Instructions (Signed)

## 2014-02-17 NOTE — MAU Provider Note (Signed)
Madison LopesYasmine M Davila is a 29 y.o. G2P1001 at 40.0 weeks presents to MAU unannounced c/o ctx since 9:30 this evening.  She denies vb or lof w/+FM   History     Patient Active Problem List   Diagnosis Date Noted  . Decreased fetal movement 02/12/2014  . Morbid obesity 02/12/2014  . Late prenatal care--at 15 weeks 02/12/2014  . Positive GBS test 02/12/2014    Chief Complaint  Patient presents with  . Labor Eval   HPI  OB History    Gravida Para Term Preterm AB TAB SAB Ectopic Multiple Living   2 1 1  0 0 0 0 0 0 1      Past Medical History  Diagnosis Date  . No significant past medical history   . Infection     UTI  . Medical history non-contributory     Past Surgical History  Procedure Laterality Date  . No past surgeries      Family History  Problem Relation Age of Onset  . Hypertension Mother   . Hypertension Father   . Cancer Paternal Grandmother     History  Substance Use Topics  . Smoking status: Former Smoker -- 0.50 packs/day    Types: Cigarettes  . Smokeless tobacco: Never Used     Comment: Feb 2015  . Alcohol Use: No     Comment: Occassional Use    Allergies: No Known Allergies  Prescriptions prior to admission  Medication Sig Dispense Refill Last Dose  . Prenatal Vit-Fe Fumarate-FA (PRENATAL MULTIVITAMIN) TABS tablet Take 1 tablet by mouth daily at 12 noon.   two weeks    ROS See HPI above, all other systems are negative  Physical Exam   Blood pressure 134/83, pulse 77, temperature 98.7 F (37.1 C), temperature source Oral, resp. rate 18, height 5\' 6"  (1.676 m), weight 264 lb (119.75 kg), last menstrual period 05/13/2013, SpO2 100 %.  Physical Exam Ext:  WNL ABD: Soft, non tender to palpation, no rebound or guarding SVE: 1/thick/high   ED Course  Assessment: IUP at  40.0 weeks Membranes:intact FHR: Category 1 CTX:  5-6 minutes mild  Plan: NST DC to home after 20 category 1 strip with labor instruction and kick counts Pt to keep  her office appointment tomorrow Pt ok to take tylenol for pain   Korah Hufstedler, CNM, MSN 02/17/2014. 11:18 PM

## 2014-02-18 ENCOUNTER — Encounter (HOSPITAL_COMMUNITY): Payer: Self-pay | Admitting: *Deleted

## 2014-02-18 ENCOUNTER — Inpatient Hospital Stay (HOSPITAL_COMMUNITY)
Admission: AD | Admit: 2014-02-18 | Discharge: 2014-02-20 | DRG: 767 | Disposition: A | Discharge status: Home / Self Care | Payer: Medicaid Other | Source: Ambulatory Visit | Attending: Obstetrics & Gynecology | Admitting: Obstetrics & Gynecology

## 2014-02-18 ENCOUNTER — Inpatient Hospital Stay (HOSPITAL_COMMUNITY): Payer: Medicaid Other | Admitting: Anesthesiology

## 2014-02-18 DIAGNOSIS — T8131XA Disruption of external operation (surgical) wound, not elsewhere classified, initial encounter: Secondary | ICD-10-CM | POA: Diagnosis not present

## 2014-02-18 DIAGNOSIS — Z302 Encounter for sterilization: Secondary | ICD-10-CM | POA: Diagnosis not present

## 2014-02-18 DIAGNOSIS — A630 Anogenital (venereal) warts: Secondary | ICD-10-CM | POA: Diagnosis present

## 2014-02-18 DIAGNOSIS — O99214 Obesity complicating childbirth: Secondary | ICD-10-CM | POA: Diagnosis present

## 2014-02-18 DIAGNOSIS — O9832 Other infections with a predominantly sexual mode of transmission complicating childbirth: Secondary | ICD-10-CM | POA: Diagnosis present

## 2014-02-18 DIAGNOSIS — Z6841 Body Mass Index (BMI) 40.0 and over, adult: Secondary | ICD-10-CM | POA: Diagnosis not present

## 2014-02-18 DIAGNOSIS — Z3A40 40 weeks gestation of pregnancy: Secondary | ICD-10-CM | POA: Diagnosis present

## 2014-02-18 DIAGNOSIS — Z87891 Personal history of nicotine dependence: Secondary | ICD-10-CM | POA: Diagnosis not present

## 2014-02-18 DIAGNOSIS — O99824 Streptococcus B carrier state complicating childbirth: Secondary | ICD-10-CM | POA: Diagnosis present

## 2014-02-18 DIAGNOSIS — O0933 Supervision of pregnancy with insufficient antenatal care, third trimester: Secondary | ICD-10-CM

## 2014-02-18 DIAGNOSIS — Z9851 Tubal ligation status: Secondary | ICD-10-CM

## 2014-02-18 LAB — CBC
HCT: 34.7 % — ABNORMAL LOW (ref 36.0–46.0)
Hemoglobin: 11.6 g/dL — ABNORMAL LOW (ref 12.0–15.0)
MCH: 27.6 pg (ref 26.0–34.0)
MCHC: 33.4 g/dL (ref 30.0–36.0)
MCV: 82.6 fL (ref 78.0–100.0)
Platelets: 251 K/uL (ref 150–400)
RBC: 4.2 MIL/uL (ref 3.87–5.11)
RDW: 14.2 % (ref 11.5–15.5)
WBC: 8.3 K/uL (ref 4.0–10.5)

## 2014-02-18 LAB — TYPE AND SCREEN
ABO/RH(D): O POS
ANTIBODY SCREEN: NEGATIVE

## 2014-02-18 LAB — ABO/RH: ABO/RH(D): O POS

## 2014-02-18 LAB — RPR

## 2014-02-18 MED ORDER — OXYCODONE-ACETAMINOPHEN 5-325 MG PO TABS: 2.0000 | ORAL_TABLET | ORAL | Status: DC | PRN

## 2014-02-18 MED ORDER — CITRIC ACID-SODIUM CITRATE 334-500 MG/5ML PO SOLN: 30.0000 mL | ORAL | Status: DC | PRN

## 2014-02-18 MED ORDER — LACTATED RINGERS IV SOLN
500.0000 mL | Freq: Once | INTRAVENOUS | Status: AC
  Administered 2014-02-18: 500 mL via INTRAVENOUS

## 2014-02-18 MED ORDER — PHENYLEPHRINE 40 MCG/ML (10ML) SYRINGE FOR IV PUSH (FOR BLOOD PRESSURE SUPPORT)
80.0000 ug | PREFILLED_SYRINGE | INTRAVENOUS | Status: DC | PRN
  Filled 2014-02-18: qty 2

## 2014-02-18 MED ORDER — LIDOCAINE HCL (PF) 1 % IJ SOLN
30.0000 mL | INTRAMUSCULAR | Status: DC | PRN
  Filled 2014-02-18: qty 30

## 2014-02-18 MED ORDER — EPHEDRINE 5 MG/ML INJ
10.0000 mg | INTRAVENOUS | Status: DC | PRN
  Filled 2014-02-18: qty 2

## 2014-02-18 MED ORDER — DIPHENHYDRAMINE HCL 25 MG PO CAPS: 25.0000 mg | ORAL_CAPSULE | Freq: Four times a day (QID) | ORAL | Status: DC | PRN

## 2014-02-18 MED ORDER — PRENATAL MULTIVITAMIN CH
1.0000 | ORAL_TABLET | Freq: Every day | ORAL | Status: DC
  Administered 2014-02-20: 1 via ORAL
  Filled 2014-02-18: qty 1

## 2014-02-18 MED ORDER — OXYCODONE-ACETAMINOPHEN 5-325 MG PO TABS
1.0000 | ORAL_TABLET | ORAL | Status: DC | PRN
  Administered 2014-02-18: 1 via ORAL
  Filled 2014-02-18: qty 1

## 2014-02-18 MED ORDER — PENICILLIN G POTASSIUM 5000000 UNITS IJ SOLR
5.0000 10*6.[IU] | Freq: Once | INTRAVENOUS | Status: AC
  Administered 2014-02-18: 5 10*6.[IU] via INTRAVENOUS
  Filled 2014-02-18: qty 5

## 2014-02-18 MED ORDER — LANOLIN HYDROUS EX OINT: TOPICAL_OINTMENT | CUTANEOUS | Status: DC | PRN

## 2014-02-18 MED ORDER — PHENYLEPHRINE 40 MCG/ML (10ML) SYRINGE FOR IV PUSH (FOR BLOOD PRESSURE SUPPORT)
80.0000 ug | PREFILLED_SYRINGE | INTRAVENOUS | Status: DC | PRN
  Filled 2014-02-18: qty 2
  Filled 2014-02-18: qty 20

## 2014-02-18 MED ORDER — ONDANSETRON HCL 4 MG/2ML IJ SOLN: 4.0000 mg | INTRAMUSCULAR | Status: DC | PRN

## 2014-02-18 MED ORDER — OXYCODONE-ACETAMINOPHEN 5-325 MG PO TABS
2.0000 | ORAL_TABLET | ORAL | Status: DC | PRN
  Administered 2014-02-19 (×2): 2 via ORAL
  Filled 2014-02-18 (×2): qty 2

## 2014-02-18 MED ORDER — ACETAMINOPHEN 325 MG PO TABS: 650.0000 mg | ORAL_TABLET | ORAL | Status: DC | PRN

## 2014-02-18 MED ORDER — FENTANYL CITRATE 0.05 MG/ML IJ SOLN
100.0000 ug | INTRAMUSCULAR | Status: DC | PRN
Start: 2014-02-18 — End: 2014-02-18
  Administered 2014-02-18: 100 ug via INTRAVENOUS
  Filled 2014-02-18: qty 2

## 2014-02-18 MED ORDER — BENZOCAINE-MENTHOL 20-0.5 % EX AERO
1.0000 "application " | INHALATION_SPRAY | CUTANEOUS | Status: DC | PRN
  Filled 2014-02-18: qty 56

## 2014-02-18 MED ORDER — ZOLPIDEM TARTRATE 5 MG PO TABS: 5.0000 mg | ORAL_TABLET | Freq: Every evening | ORAL | Status: DC | PRN

## 2014-02-18 MED ORDER — PENICILLIN G POTASSIUM 5000000 UNITS IJ SOLR
2.5000 10*6.[IU] | INTRAVENOUS | Status: DC
  Administered 2014-02-18 (×2): 2.5 10*6.[IU] via INTRAVENOUS
  Filled 2014-02-18 (×6): qty 2.5

## 2014-02-18 MED ORDER — OXYCODONE-ACETAMINOPHEN 5-325 MG PO TABS: 1.0000 | ORAL_TABLET | ORAL | Status: DC | PRN

## 2014-02-18 MED ORDER — SIMETHICONE 80 MG PO CHEW: 80.0000 mg | CHEWABLE_TABLET | ORAL | Status: DC | PRN

## 2014-02-18 MED ORDER — ONDANSETRON HCL 4 MG/2ML IJ SOLN: 4.0000 mg | Freq: Four times a day (QID) | INTRAMUSCULAR | Status: DC | PRN

## 2014-02-18 MED ORDER — EPHEDRINE 5 MG/ML INJ
10.0000 mg | INTRAVENOUS | Status: DC | PRN
Start: 2014-02-18 — End: 2014-02-18
  Filled 2014-02-18: qty 2

## 2014-02-18 MED ORDER — TETANUS-DIPHTH-ACELL PERTUSSIS 5-2.5-18.5 LF-MCG/0.5 IM SUSP: 0.5000 mL | Freq: Once | INTRAMUSCULAR | Status: DC

## 2014-02-18 MED ORDER — OXYTOCIN BOLUS FROM INFUSION: 500.0000 mL | INTRAVENOUS | Status: DC

## 2014-02-18 MED ORDER — ONDANSETRON HCL 4 MG PO TABS
4.0000 mg | ORAL_TABLET | ORAL | Status: DC | PRN
Start: 2014-02-18 — End: 2014-02-20

## 2014-02-18 MED ORDER — LACTATED RINGERS IV SOLN: 500.0000 mL | INTRAVENOUS | Status: DC | PRN

## 2014-02-18 MED ORDER — LIDOCAINE HCL (PF) 1 % IJ SOLN
INTRAMUSCULAR | Status: DC | PRN
  Administered 2014-02-18 (×2): 8 mL

## 2014-02-18 MED ORDER — SENNOSIDES-DOCUSATE SODIUM 8.6-50 MG PO TABS
2.0000 | ORAL_TABLET | ORAL | Status: DC
Start: 2014-02-19 — End: 2014-02-20
  Administered 2014-02-18 – 2014-02-20 (×2): 2 via ORAL
  Filled 2014-02-18 (×2): qty 2

## 2014-02-18 MED ORDER — IBUPROFEN 600 MG PO TABS
600.0000 mg | ORAL_TABLET | Freq: Four times a day (QID) | ORAL | Status: DC
  Administered 2014-02-18 – 2014-02-20 (×6): 600 mg via ORAL
  Filled 2014-02-18 (×6): qty 1

## 2014-02-18 MED ORDER — DIBUCAINE 1 % RE OINT: 1.0000 "application " | TOPICAL_OINTMENT | RECTAL | Status: DC | PRN

## 2014-02-18 MED ORDER — LACTATED RINGERS IV SOLN
INTRAVENOUS | Status: DC
  Administered 2014-02-18 (×2): via INTRAVENOUS

## 2014-02-18 MED ORDER — WITCH HAZEL-GLYCERIN EX PADS: 1.0000 "application " | MEDICATED_PAD | CUTANEOUS | Status: DC | PRN

## 2014-02-18 MED ORDER — FENTANYL 2.5 MCG/ML BUPIVACAINE 1/10 % EPIDURAL INFUSION (WH - ANES)
14.0000 mL/h | INTRAMUSCULAR | Status: DC | PRN
  Administered 2014-02-18 (×2): 14 mL/h via EPIDURAL
  Filled 2014-02-18 (×2): qty 125

## 2014-02-18 MED ORDER — FENTANYL 2.5 MCG/ML BUPIVACAINE 1/10 % EPIDURAL INFUSION (WH - ANES)
INTRAMUSCULAR | Status: DC | PRN
  Administered 2014-02-18: 14 mL/h via EPIDURAL

## 2014-02-18 MED ORDER — OXYTOCIN 40 UNITS IN LACTATED RINGERS INFUSION - SIMPLE MED
62.5000 mL/h | INTRAVENOUS | Status: DC
  Filled 2014-02-18: qty 1000

## 2014-02-18 MED ORDER — DIPHENHYDRAMINE HCL 50 MG/ML IJ SOLN
12.5000 mg | INTRAMUSCULAR | Status: AC | PRN
  Administered 2014-02-18 (×3): 12.5 mg via INTRAVENOUS
  Filled 2014-02-18 (×2): qty 1

## 2014-02-18 NOTE — Anesthesia Procedure Notes (Signed)
Epidural Patient location during procedure: OB Start time: 02/18/2014 3:42 AM End time: 02/18/2014 3:46 AM  Staffing Anesthesiologist: Leilani AbleHATCHETT, Arloa Prak Performed by: anesthesiologist   Preanesthetic Checklist Completed: patient identified, surgical consent, pre-op evaluation, timeout performed, IV checked, risks and benefits discussed and monitors and equipment checked  Epidural Patient position: sitting Prep: site prepped and draped and DuraPrep Patient monitoring: continuous pulse ox and blood pressure Approach: midline Location: L3-L4 Injection technique: LOR air  Needle:  Needle type: Tuohy  Needle gauge: 17 G Needle length: 9 cm and 9 Needle insertion depth: 9 cm Catheter type: closed end flexible Catheter size: 19 Gauge Catheter at skin depth: 14 cm Test dose: negative and Other  Assessment Sensory level: T9 Events: blood not aspirated, injection not painful, no injection resistance, negative IV test and no paresthesia

## 2014-02-18 NOTE — MAU Provider Note (Signed)
MAU Addendum Note  Pt returned c/o ctx are more painful and more frequent that before VE changed since last check.  03-17-78/-3  Plan: Admit to L&D for expectant management      Gretel Cantu, CNM, MSN 02/18/2014. 2:32 AM

## 2014-02-18 NOTE — Progress Notes (Signed)
Madison LopesYasmine M Davila is a 29 y.o. G2P1001 at 8869w1d   Subjective: No complaints  Objective: BP 115/71 mmHg  Pulse 84  Temp(Src) 98.3 F (36.8 C) (Oral)  Resp 18  Ht 5\' 6"  (1.676 m)  Wt 119.75 kg (264 lb)  BMI 42.63 kg/m2  SpO2 100%  LMP 05/13/2013      FHT:  FHR: 120s bpm, variability: moderate,  accelerations:  Present,  decelerations:  Absent UC:   regular, every 2-4 minutes SVE:   Dilation: 9 Station: -1 Exam by:: dr. Suzzette Righterroberts Madison Davila AROM'd clear fluid  Labs: Lab Results  Component Value Date   WBC 8.3 02/18/2014   HGB 11.6* 02/18/2014   HCT 34.7* 02/18/2014   MCV 82.6 02/18/2014   PLT 251 02/18/2014    Assessment / Plan: Spontaneous labor, progressing normally  Labor: Progressing normally Preeclampsia:  n/a Fetal Wellbeing:  Category I Pain Control:  Epidural I/D:  GBS+ Anticipated MOD:  NSVD  Madison Davila Y 02/18/2014, 11:57 AM

## 2014-02-18 NOTE — Progress Notes (Signed)
Labor Progress  Subjective: Comfortable with epidural  Objective: BP 108/70 mmHg  Pulse 71  Temp(Src) 99.2 F (37.3 C) (Oral)  Resp 20  Ht 5\' 6"  (1.676 m)  Wt 264 lb (119.75 kg)  BMI 42.63 kg/m2  SpO2 100%  LMP 05/13/2013     FHT: 125 CTX:  irregular, every 3-6 minutes Uterus gravid, soft non tender SVE:  Dilation: 2.5 Exam by:: V Calee Nugent CNM   Assessment:  IUP at 40.1 weeks NICHD: Category 1 Membranes:  intact Labor progress: early GBS: positive  Plan: Continue labor plan Continuous monitoring Rest Frequent position changes to facilitate fetal rotation and descent.       Sahan Pen, CNM, MSN 02/18/2014. 5:52 AM

## 2014-02-18 NOTE — Progress Notes (Signed)
Berline LopesYasmine M Lang is a 29 y.o. G2P1001 at 255w1d   Subjective: No complaints  Objective: BP 115/71 mmHg  Pulse 84  Temp(Src) 98.3 F (36.8 C) (Oral)  Resp 18  Ht 5\' 6"  (1.676 m)  Wt 119.75 kg (264 lb)  BMI 42.63 kg/m2  SpO2 100%  LMP 05/13/2013      FHT:  FHR: 120s bpm, variability: moderate,  accelerations:  Present,  decelerations:  Absent UC:   regular, every 3-4 minutes SVE:   Dilation: 6.5 Station: -2 Exam by:: hk  Labs: Lab Results  Component Value Date   WBC 8.3 02/18/2014   HGB 11.6* 02/18/2014   HCT 34.7* 02/18/2014   MCV 82.6 02/18/2014   PLT 251 02/18/2014    Assessment / Plan: Spontaneous labor, progressing normally  Labor: Progressing normally Preeclampsia:  n/a Fetal Wellbeing:  Cat 1 Pain Control:  Epidural I/D:  GBS + on PCN Anticipated MOD:  NSVD  Tommy Minichiello Y 02/18/2014, 11:29 AM

## 2014-02-18 NOTE — H&P (Signed)
Madison Davila is a 29 y.o. female, G2 P1001 at 40.1 weeks  Patient Active Problem List   Diagnosis Date Noted  . Decreased fetal movement 02/12/2014  . Morbid obesity 02/12/2014  . Late prenatal care--at 15 weeks 02/12/2014  . Positive GBS test 02/12/2014    Pregnancy Course: Patient entered care at 15.5 weeks.   EDC of 02/17/14 was established by LMP.      US evaluations:   15.5 weeks - AUA 16.0 weeks, date will remain per LMP. FHR 143,  EFW 5oz-72%, Cervix length 3.11 and closed, anterior placenta, normal placenta cord and fluid. Partial anatomy FU needed. Female infant,   19.6 weeks - Anatomy: EFW 11oz - 52%, FHR 140, cervix 4.79, breech, anterior placenta, no previa, normal cord placement, normal fluid, normal anatomy, cervix closed female     22.6 weeks - FU:  EFW 1lb 4oz - 61.6%, cervix 5.82, FHR 151, anterior placenta  29.6 weeks - FU: EFW 3lb 3oz - 36%, AFI 18.3, FHR 143, vertex, anterior placenta, cervix long  33.1 weeks - FU: FHR 148, Transverse with head maternal R. BPP 8/8 in 25 mins. AFI 18.6 cm c/w 65%. 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 No significant past medical history   . Infection     UTI  . Medical history  non-contributory    Past Surgical History  Procedure Laterality Date  . No past surgeries     Family History: family history includes Cancer in her paternal grandmother; Hypertension in her father and mother. Social History:  reports that she has quit smoking. Her smoking use included Cigarettes. She smoked 0.50 packs per day. She has never used smokeless tobacco. She reports that she does not drink alcohol or use illicit drugs.   Prenatal Transfer Tool  Maternal Diabetes: No Genetic Screening: Normal Maternal Ultrasounds/Referrals: Normal Fetal Ultrasounds or other Referrals:  None Maternal Substance Abuse:  No Significant Maternal Medications:  None Significant Maternal Lab Results: Lab values include: Group B Strep positive   ROS:  See HPI above, all other systems are negative  No Known Allergies  Dilation: 2.5 Exam by:: V Rigby Leonhardt CNM Blood pressure 142/88, pulse 91, temperature 97.8 F (36.6 C), temperature source Oral, resp. rate 20, height 5\' 6"  (1.676 m), weight 264 lb (119.75 kg), last menstrual period 05/13/2013, SpO2 100 %.  Maternal Exam:  Uterine Assessment: Contraction frequency is rare.  Abdomen: Gravid, non tender. Fundal height is aga.  Normal  external genitalia, vulva, cervix, uterus and adnexa.  No lesions noted on exam.  Pelvis adequate for delivery.  Fetal presentation: Vertex by VE  Fetal Exam:  Monitor Surveillance : Continuous Monitoring   Mode: Ultrasound.  NICHD: Category 1 CTXs: Q EFW   7.5 lbs  Physical Exam: Nursing note and vitals reviewed General: alert and cooperative She appears well nourished Psychiatric: Normal mood and affect. Her behavior is normal Head: Normocephalic Eyes: Pupils are equal, round, and reactive to light Neck: Normal range of motion Cardiovascular: RRR without murmur  Respiratory: CTAB. Effort normal  Abd: soft, non-tender, +BS, no rebound, no guarding  Genitourinary: Vagina normal  Neurological:  A&Ox3 Skin: Warm and dry  Musculoskeletal: Normal range of motion  Homan's sign negative bilaterally No evidence of DVTs.  Edema: No edema DTR: 2+ Clonus: None   Prenatal labs: ABO, Rh:  O positive Antibody:  negative Rubella:   immune RPR:   NR HBsAg:   negative HIV:   negative GBS:  positive Sickle cell/Hgb electrophoresis:  AA Pap:  WNL 08/31/13 GC:   negative Chlamydia: negative Genetic screenings: wnl  Glucola:  wnl  Assessment:  IUP at 40 weeks NICHD: Category 1 Membranes: intact GBS positive  Plan:  Admit to L&D for expectant/active management of labor. Possible augmentation options reviewed including foley bulb, AROM and/or pitocin.   GBS prophylaxis with PCN G per Kymari Nuon dosing with onset of active labor.  IV pain medication per orders PRN Epidural per patient request Foley cath after patient is comfortable with epidural Anticipate SVD  Labor mgmt as ordered  Okay to ambulate around unit with wireless monitors  Okay to get up and shower without monitoring   May auscultate FHR intermittently,  if expectant management     q 30 min in active labor     q 15 min in transition     q 5 min with pushing.     May ambulate without monitoring.     If no active labor, may do NST q 2 hours.   Attending MD available at all times.  Launi Asencio, CNM, MSN 02/18/2014, 2:42 AM       All information will be confirmed upon admisson

## 2014-02-18 NOTE — Anesthesia Preprocedure Evaluation (Signed)
Anesthesia Evaluation  Patient identified by MRN, date of birth, ID band Patient awake    Reviewed: Allergy & Precautions, H&P , NPO status , Patient's Chart, lab work & pertinent test results  Airway Mallampati: II TM Distance: >3 FB Neck ROM: full    Dental no notable dental hx.    Pulmonary former smoker,    Pulmonary exam normal       Cardiovascular negative cardio ROS      Neuro/Psych negative neurological ROS  negative psych ROS   GI/Hepatic negative GI ROS, Neg liver ROS,   Endo/Other  Morbid obesity  Renal/GU negative Renal ROS     Musculoskeletal   Abdominal (+) + obese,   Peds  Hematology negative hematology ROS (+)   Anesthesia Other Findings   Reproductive/Obstetrics (+) Pregnancy                           Anesthesia Physical Anesthesia Plan  ASA: III  Anesthesia Plan: Epidural   Post-op Pain Management:    Induction:   Airway Management Planned:   Additional Equipment:   Intra-op Plan:   Post-operative Plan:   Informed Consent: I have reviewed the patients History and Physical, chart, labs and discussed the procedure including the risks, benefits and alternatives for the proposed anesthesia with the patient or authorized representative who has indicated his/her understanding and acceptance.     Plan Discussed with:   Anesthesia Plan Comments:         Anesthesia Quick Evaluation  

## 2014-02-19 ENCOUNTER — Inpatient Hospital Stay (HOSPITAL_COMMUNITY): Payer: Medicaid Other | Admitting: Anesthesiology

## 2014-02-19 ENCOUNTER — Encounter (HOSPITAL_COMMUNITY)
Admission: AD | Disposition: A | Discharge status: Home / Self Care | Payer: Self-pay | Source: Ambulatory Visit | Attending: Obstetrics & Gynecology

## 2014-02-19 ENCOUNTER — Encounter (HOSPITAL_COMMUNITY): Payer: Self-pay | Admitting: Anesthesiology

## 2014-02-19 HISTORY — PX: TUBAL LIGATION: SHX77

## 2014-02-19 LAB — CBC
HEMATOCRIT: 31.8 % — AB (ref 36.0–46.0)
Hemoglobin: 10.4 g/dL — ABNORMAL LOW (ref 12.0–15.0)
MCH: 27.2 pg (ref 26.0–34.0)
MCHC: 32.7 g/dL (ref 30.0–36.0)
MCV: 83 fL (ref 78.0–100.0)
PLATELETS: 224 10*3/uL (ref 150–400)
RBC: 3.83 MIL/uL — AB (ref 3.87–5.11)
RDW: 14.2 % (ref 11.5–15.5)
WBC: 11.5 10*3/uL — AB (ref 4.0–10.5)

## 2014-02-19 LAB — HIV ANTIBODY (ROUTINE TESTING W REFLEX)
HIV 1/HIV 2 AB: NONREACTIVE
HIV 1/O/2 Abs-Index Value: <1 (ref <1.00)

## 2014-02-19 LAB — SURGICAL PCR SCREEN
MRSA, PCR: NEGATIVE
STAPHYLOCOCCUS AUREUS: POSITIVE — AB

## 2014-02-19 SURGERY — LIGATION, FALLOPIAN TUBE, POSTPARTUM
Anesthesia: Epidural | Laterality: Bilateral

## 2014-02-19 MED ORDER — LIDOCAINE-EPINEPHRINE (PF) 2 %-1:200000 IJ SOLN
INTRAMUSCULAR | Status: DC | PRN
  Administered 2014-02-19 (×2): 2 mL via EPIDURAL
  Administered 2014-02-19 (×2): 5 mL via EPIDURAL
  Administered 2014-02-19 (×2): 2 mL via EPIDURAL
  Administered 2014-02-19: 5 mL via EPIDURAL
  Administered 2014-02-19: 2 mL via EPIDURAL
  Administered 2014-02-19: 5 mL via EPIDURAL

## 2014-02-19 MED ORDER — LACTATED RINGERS IV SOLN
INTRAVENOUS | Status: DC
  Administered 2014-02-19 (×2): via INTRAVENOUS

## 2014-02-19 MED ORDER — ONDANSETRON HCL 4 MG/2ML IJ SOLN
INTRAMUSCULAR | Status: DC | PRN
  Administered 2014-02-19: 4 mg via INTRAVENOUS

## 2014-02-19 MED ORDER — LIDOCAINE-EPINEPHRINE 1 %-1:100000 IJ SOLN
INTRAMUSCULAR | Status: DC | PRN
  Administered 2014-02-19: 6 mL

## 2014-02-19 MED ORDER — FAMOTIDINE 20 MG PO TABS
40.0000 mg | ORAL_TABLET | Freq: Once | ORAL | Status: AC
  Administered 2014-02-19: 40 mg via ORAL
  Filled 2014-02-19: qty 2

## 2014-02-19 MED ORDER — FENTANYL CITRATE 0.05 MG/ML IJ SOLN: 25.0000 ug | INTRAMUSCULAR | Status: DC | PRN

## 2014-02-19 MED ORDER — SODIUM BICARBONATE 8.4 % IV SOLN
INTRAVENOUS | Status: AC
  Filled 2014-02-19: qty 50

## 2014-02-19 MED ORDER — LIDOCAINE-EPINEPHRINE (PF) 2 %-1:200000 IJ SOLN
INTRAMUSCULAR | Status: AC
  Filled 2014-02-19: qty 20

## 2014-02-19 MED ORDER — FENTANYL CITRATE 0.05 MG/ML IJ SOLN
INTRAMUSCULAR | Status: DC | PRN
  Administered 2014-02-19 (×2): 50 ug via INTRAVENOUS

## 2014-02-19 MED ORDER — MIDAZOLAM HCL 2 MG/2ML IJ SOLN
INTRAMUSCULAR | Status: AC
  Filled 2014-02-19: qty 2

## 2014-02-19 MED ORDER — LIDOCAINE-EPINEPHRINE 1 %-1:100000 IJ SOLN
10.0000 mL | Freq: Once | INTRAMUSCULAR | Status: DC
  Filled 2014-02-19: qty 10

## 2014-02-19 MED ORDER — CHLORHEXIDINE GLUCONATE CLOTH 2 % EX PADS
6.0000 | MEDICATED_PAD | Freq: Every day | CUTANEOUS | Status: DC
  Administered 2014-02-19 – 2014-02-20 (×2): 6 via TOPICAL

## 2014-02-19 MED ORDER — FENTANYL CITRATE 0.05 MG/ML IJ SOLN
INTRAMUSCULAR | Status: AC
  Filled 2014-02-19: qty 2

## 2014-02-19 MED ORDER — ONDANSETRON HCL 4 MG/2ML IJ SOLN
INTRAMUSCULAR | Status: AC
  Filled 2014-02-19: qty 2

## 2014-02-19 MED ORDER — METOCLOPRAMIDE HCL 10 MG PO TABS
10.0000 mg | ORAL_TABLET | Freq: Once | ORAL | Status: AC
  Administered 2014-02-19: 10 mg via ORAL
  Filled 2014-02-19: qty 1

## 2014-02-19 MED ORDER — MIDAZOLAM HCL 5 MG/5ML IJ SOLN
INTRAMUSCULAR | Status: DC | PRN
  Administered 2014-02-19 (×4): 1 mg via INTRAVENOUS

## 2014-02-19 MED ORDER — LIDOCAINE-EPINEPHRINE 1 %-1:100000 IJ SOLN
INTRAMUSCULAR | Status: AC
  Filled 2014-02-19: qty 1

## 2014-02-19 MED ORDER — MUPIROCIN 2 % EX OINT
1.0000 "application " | TOPICAL_OINTMENT | Freq: Two times a day (BID) | CUTANEOUS | Status: DC
  Administered 2014-02-19 – 2014-02-20 (×3): 1 via NASAL
  Filled 2014-02-19: qty 22

## 2014-02-19 SURGICAL SUPPLY — 27 items
BENZOIN TINCTURE PRP APPL 2/3 (GAUZE/BANDAGES/DRESSINGS) ×3 IMPLANT
CHLORAPREP W/TINT 26ML (MISCELLANEOUS) ×3 IMPLANT
CLOSURE WOUND 1/4 X3 (GAUZE/BANDAGES/DRESSINGS) ×1
CLOTH BEACON ORANGE TIMEOUT ST (SAFETY) ×3 IMPLANT
CONTAINER PREFILL 10% NBF 15ML (MISCELLANEOUS) ×6 IMPLANT
DRSG OPSITE POSTOP 3X4 (GAUZE/BANDAGES/DRESSINGS) ×6 IMPLANT
ELECT LIGASURE SHORT 9 REUSE (ELECTRODE) ×3 IMPLANT
ELECT REM PT RETURN 9FT ADLT (ELECTROSURGICAL) ×3
ELECTRODE REM PT RTRN 9FT ADLT (ELECTROSURGICAL) ×1 IMPLANT
GLOVE BIO SURGEON STRL SZ 6.5 (GLOVE) ×4 IMPLANT
GLOVE BIO SURGEONS STRL SZ 6.5 (GLOVE) ×2
GLOVE BIOGEL PI IND STRL 7.0 (GLOVE) ×5 IMPLANT
GLOVE BIOGEL PI INDICATOR 7.0 (GLOVE) ×10
GOWN STRL REUS W/TWL LRG LVL3 (GOWN DISPOSABLE) ×9 IMPLANT
NEEDLE HYPO 25X1 1.5 SAFETY (NEEDLE) ×3 IMPLANT
NS IRRIG 1000ML POUR BTL (IV SOLUTION) ×3 IMPLANT
PACK ABDOMINAL MINOR (CUSTOM PROCEDURE TRAY) ×3 IMPLANT
PENCIL BUTTON HOLSTER BLD 10FT (ELECTRODE) ×3 IMPLANT
STRIP CLOSURE SKIN 1/4X3 (GAUZE/BANDAGES/DRESSINGS) ×2 IMPLANT
SUT MNCRL AB 3-0 PS2 27 (SUTURE) ×3 IMPLANT
SUT PLAIN 0 NONE (SUTURE) ×3 IMPLANT
SUT VIC AB 2-0 SH 27 (SUTURE) ×2
SUT VIC AB 2-0 SH 27XBRD (SUTURE) ×1 IMPLANT
SUT VICRYL 0 UR6 27IN ABS (SUTURE) ×3 IMPLANT
SYR CONTROL 10ML LL (SYRINGE) ×3 IMPLANT
TOWEL OR 17X24 6PK STRL BLUE (TOWEL DISPOSABLE) ×6 IMPLANT
TRAY FOLEY CATH 14FR (SET/KITS/TRAYS/PACK) ×3 IMPLANT

## 2014-02-19 NOTE — Procedures (Signed)
  Madison Davila, Madison Davila.  DOB: 1986/02/08   Procedure: Mini-laparotomy incision re- closure.   I was called by Nurse because nurse had noticed some drainage on dressing and when she went to remove the dressing the incision had opened.  Upon my arrival at patient's bedside small serous-sangenous drainage was noted from incision.  Incision was cleaned with betadine.  Incision was probed with small q tip and fascia was noted to be intact.  2 cc of 1% lidocaine with epinephrine instiled over incision skin edges.  Incision closed with 4-0 monocryl.  Dermabond applied.  Patient tolerated procedure.  EBL: 5cc.  Dr. Sallye OberKulwa.

## 2014-02-19 NOTE — Anesthesia Preprocedure Evaluation (Signed)
Anesthesia Evaluation  Patient identified by MRN, date of birth, ID band Patient awake    Reviewed: Allergy & Precautions, H&P , NPO status , Patient's Chart, lab work & pertinent test results  Airway Mallampati: II TM Distance: >3 FB Neck ROM: full    Dental no notable dental hx.    Pulmonary former smoker,    Pulmonary exam normal       Cardiovascular negative cardio ROS      Neuro/Psych negative neurological ROS  negative psych ROS   GI/Hepatic negative GI ROS, Neg liver ROS,   Endo/Other  Morbid obesity  Renal/GU negative Renal ROS     Musculoskeletal   Abdominal (+) + obese,   Peds  Hematology negative hematology ROS (+)   Anesthesia Other Findings   Reproductive/Obstetrics (+) Pregnancy                           Anesthesia Physical Anesthesia Plan  ASA: III  Anesthesia Plan: Epidural   Post-op Pain Management:    Induction:   Airway Management Planned:   Additional Equipment:   Intra-op Plan:   Post-operative Plan:   Informed Consent: I have reviewed the patients History and Physical, chart, labs and discussed the procedure including the risks, benefits and alternatives for the proposed anesthesia with the patient or authorized representative who has indicated his/her understanding and acceptance.     Plan Discussed with:   Anesthesia Plan Comments:         Anesthesia Quick Evaluation  

## 2014-02-19 NOTE — Progress Notes (Signed)
Post Partum Day 1 Subjective: no complaints.  NPO after midnight. Ambulating, voiding without difficulty.  Scant lochia.  Desires bilateral tubal salpingectomy for sterilization and also to decrease her future risk of ovarian/tubal cancer.   Objective: Blood pressure 116/70, pulse 68, temperature 97.8 F (36.6 C), temperature source Oral, resp. rate 18, height 5\' 6"  (1.676 m), weight 264 lb (119.75 kg), last menstrual period 05/13/2013, SpO2 100 %, unknown if currently breastfeeding.  Physical Exam:  General: alert, no acute distress. CVS: s1, s2, RRR Pulm: CTAB Lochia: appropriate Uterine Fundus: Firm at umbulicus level.  Incision: none DVT Evaluation: No evidence of DVT seen on physical exam. Calf/Ankle edema is present.   Recent Labs  02/18/14 0240 02/19/14 0630  HGB 11.6* 10.4*  HCT 34.7* 31.8*    Assessment/Plan: Plan for discharge tomorrow  Discussed risks, benefits and alternatives of bilateral salpingectomy including risks of tubal regret, infection, damage to organs, bleeding.  Patient declines other alternatives including partial salpingectomy and desires to proceed with bilateral salpingectomy.  All questions were answered.  Patient has already signed the consent form.    LOS: 1 day   [facility] Center[facility]ULWA,Robbi Scurlock [facility] Center[facility] 02/19/2014, 9:13 AM

## 2014-02-19 NOTE — Procedures (Signed)
Surgery done on 02/19/14 at about 11 am.   PREOP DIAGNOSIS: Multiparous patient desiring permanent sterilization and also desiring to decrease her future risk of fallopian tube and ovarian cancer  POSTOP DIAGNOSIS: Same as above  PROCEDURE: Postpartum bilateral salpingectomy  SURGEON: Dr. Hoover BrownsEma Frederika Hukill  ASSISTANT: Scrub tech  ANESTHESIA: Bolussed epidural.  COMPLICATIONS: None  EBL: 10 mL  IV FLUID: 1000 mL LR  FINDINGS: Normal uterus. Normal fallopian tubes bilaterally. Normal right and left ovary.  PROCEDURE:   Informed consent was obtained from the patient to undergo the procedure after discussing the risks benefits and alternatives of the procedure. She was taken to the operating room where anesthesia was administered without difficulty. She was prepped abdominally and a foley catheter was placed in the bladder.  Attention was then turned to the abdomen where 1% lidocaine with epinephrine was instilled in the infraumbilical area. The skin was incised 5cm with a scalpel and and entry into the abdomen was made through the subcutaneous, fascia and peritoneal layers using Allis clamps, hemostats and S retractors.    The left fallopian tube was then identified and brought to the incision.  It was then transected from the mesosalpinx using LigaSure.  Excellent hemostasis was noted.  The right fallopian tube was similarly identified and transected using the LigaSure.   Umbilical incision was closed using 0 Vicryl in a running stitch.  The skin was closed with 4-0 Monocryl.  Honey comb  Dressing was placed. The patient was then taken to recovery room in stable condition  SPECIMEN: Left and right fallopian tubes

## 2014-02-19 NOTE — Transfer of Care (Signed)
Immediate Anesthesia Transfer of Care Note  Patient: Madison Davila  Procedure(s) Performed: Procedure(s): POST PARTUM Bilateral Salpingectomies (Bilateral)  Patient Location: PACU  Anesthesia Type:Epidural  Level of Consciousness: awake  Airway & Oxygen Therapy: Patient Spontanous Breathing and Patient connected to nasal cannula oxygen  Post-op Assessment: Report given to PACU RN and Post -op Vital signs reviewed and stable  Post vital signs: stable  Complications: No apparent anesthesia complications

## 2014-02-19 NOTE — Anesthesia Postprocedure Evaluation (Signed)
Anesthesia Post Note  Patient: Madison Davila  Procedure(s) Performed: * No procedures listed *  Anesthesia type: Epidural  Patient location: Mother/Baby  Post pain: Pain level controlled  Post assessment: Post-op Vital signs reviewed  Last Vitals:  Filed Vitals:   02/19/14 0523  BP: 119/75  Pulse: 69  Temp: 36.7 C  Resp: 18    Post vital signs: Reviewed  Level of consciousness: awake  Complications: No apparent anesthesia complications

## 2014-02-19 NOTE — Progress Notes (Signed)
UR chart review completed.  

## 2014-02-20 DIAGNOSIS — Z9851 Tubal ligation status: Secondary | ICD-10-CM

## 2014-02-20 LAB — CBC
HEMATOCRIT: 30.5 % — AB (ref 36.0–46.0)
HEMOGLOBIN: 9.9 g/dL — AB (ref 12.0–15.0)
MCH: 27.3 pg (ref 26.0–34.0)
MCHC: 32.5 g/dL (ref 30.0–36.0)
MCV: 84 fL (ref 78.0–100.0)
PLATELETS: 219 10*3/uL (ref 150–400)
RBC: 3.63 MIL/uL — ABNORMAL LOW (ref 3.87–5.11)
RDW: 14.3 % (ref 11.5–15.5)
WBC: 6.7 10*3/uL (ref 4.0–10.5)

## 2014-02-20 MED ORDER — IBUPROFEN 600 MG PO TABS: 600.0000 mg | ORAL_TABLET | Freq: Four times a day (QID) | ORAL | Status: DC | PRN

## 2014-02-20 MED ORDER — OXYCODONE-ACETAMINOPHEN 5-325 MG PO TABS: 1.0000 | ORAL_TABLET | ORAL | Status: DC | PRN

## 2014-02-20 MED ORDER — MUPIROCIN 2 % EX OINT: 1.0000 "application " | TOPICAL_OINTMENT | Freq: Two times a day (BID) | CUTANEOUS | Status: DC

## 2014-02-20 NOTE — Anesthesia Postprocedure Evaluation (Signed)
Anesthesia Post Note  Patient: Berline LopesYasmine M Heckert  Procedure(s) Performed: Procedure(s) (LRB): POST PARTUM Bilateral Salpingectomies (Bilateral)  Anesthesia type: Epidural  Patient location: Mother/Baby  Post pain: Pain level controlled  Post assessment: Post-op Vital signs reviewed  Last Vitals:  Filed Vitals:   02/20/14 0751  BP: 110/62  Pulse: 68  Temp: 36.8 C  Resp: 18    Post vital signs: Reviewed  Level of consciousness: awake  Complications: No apparent anesthesia complications

## 2014-02-20 NOTE — Discharge Instructions (Signed)
Postpartum Care After Vaginal Delivery °After you deliver your newborn (postpartum period), the usual stay in the hospital is 24-72 hours. If there were problems with your labor or delivery, or if you have other medical problems, you might be in the hospital longer.  °While you are in the hospital, you will receive help and instructions on how to care for yourself and your newborn during the postpartum period.  °While you are in the hospital: °· Be sure to tell your nurses if you have pain or discomfort, as well as where you feel the pain and what makes the pain worse. °· If you had an incision made near your vagina (episiotomy) or if you had some tearing during delivery, the nurses may put ice packs on your episiotomy or tear. The ice packs may help to reduce the pain and swelling. °· If you are breastfeeding, you may feel uncomfortable contractions of your uterus for a couple of weeks. This is normal. The contractions help your uterus get back to normal size. °· It is normal to have some bleeding after delivery. °· For the first 1-3 days after delivery, the flow is red and the amount may be similar to a period. °· It is common for the flow to start and stop. °· In the first few days, you may pass some small clots. Let your nurses know if you begin to pass large clots or your flow increases. °· Do not  flush blood clots down the toilet before having the nurse look at them. °· During the next 3-10 days after delivery, your flow should become more watery and pink or brown-tinged in color. °· Ten to fourteen days after delivery, your flow should be a small amount of yellowish-white discharge. °· The amount of your flow will decrease over the first few weeks after delivery. Your flow may stop in 6-8 weeks. Most women have had their flow stop by 12 weeks after delivery. °· You should change your sanitary pads frequently. °· Wash your hands thoroughly with soap and water for at least 20 seconds after changing pads, using  the toilet, or before holding or feeding your newborn. °· You should feel like you need to empty your bladder within the first 6-8 hours after delivery. °· In case you become weak, lightheaded, or faint, call your nurse before you get out of bed for the first time and before you take a shower for the first time. °· Within the first few days after delivery, your breasts may begin to feel tender and full. This is called engorgement. Breast tenderness usually goes away within 48-72 hours after engorgement occurs. You may also notice milk leaking from your breasts. If you are not breastfeeding, do not stimulate your breasts. Breast stimulation can make your breasts produce more milk. °· Spending as much time as possible with your newborn is very important. During this time, you and your newborn can feel close and get to know each other. Having your newborn stay in your room (rooming in) will help to strengthen the bond with your newborn.  It will give you time to get to know your newborn and become comfortable caring for your newborn. °· Your hormones change after delivery. Sometimes the hormone changes can temporarily cause you to feel sad or tearful. These feelings should not last more than a few days. If these feelings last longer than that, you should talk to your caregiver. °· If desired, talk to your caregiver about methods of family planning or contraception. °·   Talk to your caregiver about immunizations. Your caregiver may want you to have the following immunizations before leaving the hospital: °· Tetanus, diphtheria, and pertussis (Tdap) or tetanus and diphtheria (Td) immunization. It is very important that you and your family (including grandparents) or others caring for your newborn are up-to-date with the Tdap or Td immunizations. The Tdap or Td immunization can help protect your newborn from getting ill. °· Rubella immunization. °· Varicella (chickenpox) immunization. °· Influenza immunization. You should  receive this annual immunization if you did not receive the immunization during your pregnancy. °Document Released: 11/26/2006 Document Revised: 10/24/2011 Document Reviewed: 09/26/2011 °ExitCare® Patient Information ©2015 [employer]. This information is not intended to replace advice given to you by your health care provider. Make sure you discuss any questions you have with your health care provider. ° °Postpartum Tubal Ligation °Care After °Refer to this sheet in the next few weeks. These instructions provide you with information on caring for yourself after your procedure. Your caregiver may also give you more specific instructions. Your treatment has been planned according to current medical practices, but problems sometimes occur. Call your caregiver if you have any problems or questions after your procedure. °HOME CARE INSTRUCTIONS  °· Rest the remainder of the day. °· Only take over-the-counter or prescription medicines for pain, discomfort, or fever as directed by your caregiver. Do not take aspirin. It can cause bleeding. °· Gradually resume daily activities, diet, rest, driving, and work. °· Avoid sexual intercourse for 2 weeks or as directed. °· Do not drive while taking pain medicine. °· Do not lift anything over 5 pounds for 2 weeks or as directed. °· Only take showers, not baths, until you are seen by your caregiver. °· Change bandages (dressings) as directed. °· Take your temperature twice a day and record it. °· Try to have help for the first 7-10 days for your household needs. °· Return to your caregiver to get your stitches (sutures) removed and for follow-up visits as directed. °SEEK MEDICAL CARE IF:  °· You have redness, swelling, or increasing pain in the wound. °· You have drainage from the wound lasting longer than 1 day. °· Your pain is getting worse. °· You have a rash. °· You become dizzy or lightheaded. °· You have a reaction to your medicine. °· You need stronger medicine or a change  in your pain medicine. °· You notice a bad smell coming from the wound or dressing. °· Your wound breaks open after the sutures have been removed. °· You are constipated. °SEEK IMMEDIATE MEDICAL CARE IF:  °· You faint. °· You have a fever. °· You have increasing abdominal pain. °· You have severe pain in your shoulders. °· You have bleeding or drainage from the suture sites or vagina following surgery. °· You have shortness of breath or difficulty breathing. °· You have chest or leg pain. °· You have persistent nausea, vomiting, or diarrhea. °MAKE SURE YOU:  °· Understand these instructions. °· Watch your condition. °· Get help right away if you are not doing well or get worse. °Document Released: 07/31/2011 Document Reviewed: 07/31/2011 °ExitCare® Patient Information ©2015 [employer]. This information is not intended to replace advice given to you by your health care provider. Make sure you discuss any questions you have with your health care provider. ° °

## 2014-02-20 NOTE — Discharge Summary (Signed)
Vaginal Delivery Discharge Summary  Madison Davila  DOB:    11-17-1985 MRN:    [id number] CSN:    [id number]  Date of admission:                  02/18/14  Date of discharge:                   02/20/13  Procedures this admission:   SVB, bilateral tubal ligation, mini-laparotomy incision re-closure  Date of Delivery: 02/18/14  Newborn Data:  Live born female  Birth Weight: 6 lb 9.5 oz (2990 g) APGAR: 9, 9  Home with mother.  Circumcision Plan: Outpatient  History of Present Illness:  Ms. Madison LopesYasmine M Vullo is a 29 y.o. female, G2P2002, who presents at 8769w1d weeks gestation. The patient [facility]. She was admitted onset of labor. Her pregnancy has been complicated by:  Patient Active Problem List   Diagnosis Date Noted  . Vaginal delivery 02/20/2014  . History of bilateral tubal ligation 02/20/2014  . Normal labor 02/18/2014  . Morbid obesity 02/12/2014  . Late prenatal care--at 15 weeks 02/12/2014  . Positive GBS test 02/12/2014     Hospital Course:  Admitted 02/18/14 in early labor. Positive GBS. Progressed without augmentation. Utilized epidural for pain management.  Delivery was performed by Dr. Su Hiltoberts without complication. Patient and baby tolerated the procedure without difficulty, with 1st degree periurethral laceration noted. Infant status was stable and remained in room with mother.  Patient desired pp BTL, which was done on day 1 by Dr. Sallye OberKulwa.  Patient had disruption of external skin sutures later on day 1, with reclosure of the incision by Dr. Sallye OberKulwa at the bedside.  Mother and infant then had an uncomplicated postpartum course, with breast feeding going well. Mom's physical exam was WNL, and she was discharged home in stable condition. Contraception plan was BTL done on 02/19/14.  She received adequate benefit from po pain medications, with use of Percocet and Motrin.  Per Dr. Irineo AxonKulwa's  request, patient will be seen at the office in 1 week for recheck of incision.   Feeding:  breast  Contraception:  bilateral tubal ligation  Discharge hemoglobin:  HEMOGLOBIN  Date Value Ref Range Status  02/20/2014 9.9* 12.0 - 15.0 g/dL Final   HCT  Date Value Ref Range Status  02/20/2014 30.5* 36.0 - 46.0 % Final    Discharge Physical Exam:   General: alert Lochia: appropriate Uterine Fundus: firm Incision: healing well DVT Evaluation: No evidence of DVT seen on physical exam. Negative Homan's sign.  Intrapartum Procedures: spontaneous vaginal delivery Postpartum Procedures: P.P. tubal ligation, reclosure of BTL incision Complications-Operative and Postpartum: 1st degree periurethral  Discharge Diagnoses: Term Pregnancy-delivered, BTL  Discharge Information:  Activity:           pelvic rest Diet:                routine Medications: Ibuprofen and Percocet Condition:      stable Instructions:     Discharge to: home  Follow-up Information    [facility]. Schedule an appointment as soon as possible for a visit in 1 week.   Specialty:  Obstetrics and Gynecology   Why:  Office will call you on Monday to schedule a follow-up appt with Dr. Sallye OberKulwa in 1 week.  Call with any concerns or questions.   Contact information:  [address] [place] [address] [phone]       Nigel Bridgeman [facility] 02/20/2014 7:32 AM

## 2014-02-22 ENCOUNTER — Encounter (HOSPITAL_COMMUNITY): Payer: Self-pay | Admitting: Obstetrics & Gynecology

## 2014-02-28 ENCOUNTER — Inpatient Hospital Stay (HOSPITAL_COMMUNITY)
Admission: AD | Admit: 2014-02-28 | Discharge: 2014-02-28 | Disposition: A | Discharge status: Home / Self Care | Payer: Medicaid Other | Source: Ambulatory Visit | Attending: Obstetrics and Gynecology | Admitting: Obstetrics and Gynecology

## 2014-02-28 ENCOUNTER — Encounter (HOSPITAL_COMMUNITY): Payer: Self-pay | Admitting: *Deleted

## 2014-02-28 DIAGNOSIS — O9089 Other complications of the puerperium, not elsewhere classified: Secondary | ICD-10-CM | POA: Insufficient documentation

## 2014-02-28 DIAGNOSIS — Z87891 Personal history of nicotine dependence: Secondary | ICD-10-CM | POA: Diagnosis not present

## 2014-02-28 DIAGNOSIS — R109 Unspecified abdominal pain: Secondary | ICD-10-CM | POA: Diagnosis present

## 2014-02-28 LAB — CBC
HCT: 35.1 % — ABNORMAL LOW (ref 36.0–46.0)
HEMOGLOBIN: 11.5 g/dL — AB (ref 12.0–15.0)
MCH: 27.4 pg (ref 26.0–34.0)
MCHC: 32.8 g/dL (ref 30.0–36.0)
MCV: 83.6 fL (ref 78.0–100.0)
Platelets: 285 10*3/uL (ref 150–400)
RBC: 4.2 MIL/uL (ref 3.87–5.11)
RDW: 14.2 % (ref 11.5–15.5)
WBC: 6.7 10*3/uL (ref 4.0–10.5)

## 2014-02-28 NOTE — Discharge Instructions (Signed)
Postpartum Tubal Ligation °Care After °Refer to this sheet in the next few weeks. These instructions provide you with information on caring for yourself after your procedure. Your caregiver may also give you more specific instructions. Your treatment has been planned according to current medical practices, but problems sometimes occur. Call your caregiver if you have any problems or questions after your procedure. °HOME CARE INSTRUCTIONS  °· Rest the remainder of the day. °· Only take over-the-counter or prescription medicines for pain, discomfort, or fever as directed by your caregiver. Do not take aspirin. It can cause bleeding. °· Gradually resume daily activities, diet, rest, driving, and work. °· Avoid sexual intercourse for 2 weeks or as directed. °· Do not drive while taking pain medicine. °· Do not lift anything over 5 pounds for 2 weeks or as directed. °· Only take showers, not baths, until you are seen by your caregiver. °· Change bandages (dressings) as directed. °· Take your temperature twice a day and record it. °· Try to have help for the first 7-10 days for your household needs. °· Return to your caregiver to get your stitches (sutures) removed and for follow-up visits as directed. °SEEK MEDICAL CARE IF:  °· You have redness, swelling, or increasing pain in the wound. °· You have drainage from the wound lasting longer than 1 day. °· Your pain is getting worse. °· You have a rash. °· You become dizzy or lightheaded. °· You have a reaction to your medicine. °· You need stronger medicine or a change in your pain medicine. °· You notice a bad smell coming from the wound or dressing. °· Your wound breaks open after the sutures have been removed. °· You are constipated. °SEEK IMMEDIATE MEDICAL CARE IF:  °· You faint. °· You have a fever. °· You have increasing abdominal pain. °· You have severe pain in your shoulders. °· You have bleeding or drainage from the suture sites or vagina following surgery. °· You  have shortness of breath or difficulty breathing. °· You have chest or leg pain. °· You have persistent nausea, vomiting, or diarrhea. °MAKE SURE YOU:  °· Understand these instructions. °· Watch your condition. °· Get help right away if you are not doing well or get worse. °Document Released: 07/31/2011 Document Reviewed: 07/31/2011 °ExitCare® Patient Information ©2015 [employer]. This information is not intended to replace advice given to you by your health care provider. Make sure you discuss any questions you have with your health care provider. ° °

## 2014-02-28 NOTE — MAU Note (Signed)
Pt reports she delivered 10 days ago and had BTL on 1/8. Started having sudden onset of pain above her umbilicus. Pt had  A hernia in that area.  Pt had an episode of emesis prior to the pain as well.

## 2014-02-28 NOTE — MAU Provider Note (Signed)
History  Madison MariscalYasmine M. Cecilio Davila is a 29 yo who presents to MAU via EMS with complaint of "belly pain." Reports she delivered 10 days ago and had BTL on 1/8. Was doing well until started having sudden onset of pain above her umbilicus approximately 2 hours prior to presentation. Reports episode of emesis prior to onset of pain. Denies fever and chills. Denies discharge or draining from surgical site. Took Motrin and Percocet prior to calling EMS. Had uncomplicated follow-up in office 2 days prior to presentation.   Patient Active Problem List   Diagnosis Date Noted  . Belly pain 02/28/2014  . Vaginal delivery 02/20/2014  . History of bilateral tubal ligation 02/20/2014  . Morbid obesity 02/12/2014  . Positive GBS test 02/12/2014    Chief Complaint  Patient presents with  . Abdominal Pain   HPI As above OB History    Gravida Para Term Preterm AB TAB SAB Ectopic Multiple Living   2 2 2  0 0 0 0 0 0 2      Past Medical History  Diagnosis Date  . No significant past medical history   . Infection     UTI  . Medical history non-contributory     Past Surgical History  Procedure Laterality Date  . No past surgeries    . Tubal ligation Bilateral 02/19/2014    Procedure: POST PARTUM Bilateral Salpingectomies;  Surgeon: Konrad FelixEma Wakuru Kulwa, MD;  Location: [facility];  Service: Gynecology;  Laterality: Bilateral;    Family History  Problem Relation Age of Onset  . Hypertension Mother   . Hypertension Father   . Cancer Paternal Grandmother     History  Substance Use Topics  . Smoking status: Former Smoker -- 0.50 packs/day    Types: Cigarettes  . Smokeless tobacco: Never Used     Comment: Feb 2015  . Alcohol Use: No     Comment: Occassional Use    Allergies: No Known Allergies  No prescriptions prior to admission    ROS  Belly pain Physical Exam   Results for orders placed or performed during the hospital encounter of 02/28/14 (from the past 72 hour(s))  CBC     Status: Abnormal   Collection Time: 02/28/14  1:56 AM  Result Value Ref Range   WBC 6.7 4.0 - 10.5 K/uL   RBC 4.20 3.87 - 5.11 MIL/uL   Hemoglobin 11.5 (L) 12.0 - 15.0 g/dL   HCT 95.635.1 (L) 21.336.0 - 08.646.0 %   MCV 83.6 78.0 - 100.0 fL   MCH 27.4 26.0 - 34.0 pg   MCHC 32.8 30.0 - 36.0 g/dL   RDW 57.814.2 46.911.5 - 62.915.5 %   Platelets 285 150 - 400 K/uL   Blood pressure 124/74, pulse 67, temperature 97.9 F (36.6 C), temperature source Oral, resp. rate 16, unknown if currently breastfeeding.   Physical Exam  Gen: NAD, sleeping in bed  Lungs: CTA bilat CV: RRR, no murmurs Abd: +BS, soft, minimal peri-incisional TTP, incision intact with residual Dermabond in place, no erythema, no discharge or draining from surgical site. No guarding or rebound Pelvic:Deferred  ED Course  Assessment:  Afebrile with normal CBC and unremarkable abdominal exam. Reviewed presentation and clinical exam findings with Dr. Stefano GaulStringer. Strongly suspect inadequate pain control since much improved since arrival and took pain meds prior to calling EMS.   Reviewed plan of care with patient and mother. All questions answered.  Plan: Discharge home with precautions Pain meds prn Routine f/u   [name], Adalida Garver CNM,  MS 02/28/2014, 02:07 AM

## 2014-11-24 ENCOUNTER — Encounter (HOSPITAL_COMMUNITY): Payer: Self-pay | Admitting: Emergency Medicine

## 2014-11-24 DIAGNOSIS — R51 Headache: Secondary | ICD-10-CM | POA: Insufficient documentation

## 2014-11-24 NOTE — ED Notes (Signed)
C/o headache with light sensitivity x 1 week.  No neuro deficits.  Denies any other symptoms.  Last took medication for headache on Sunday (Ibuprofen without relief).  States headache is worse in the mornings.

## 2014-11-25 ENCOUNTER — Emergency Department (HOSPITAL_COMMUNITY)
Admission: EM | Admit: 2014-11-25 | Discharge: 2014-11-25 | Discharge status: Against Medical Advice | Payer: Medicaid Other | Attending: Emergency Medicine | Admitting: Emergency Medicine

## 2014-11-25 NOTE — ED Notes (Signed)
Pt seen leaving facility by staff - unable to locate pt.

## 2015-04-11 ENCOUNTER — Encounter: Payer: Self-pay | Admitting: *Deleted

## 2015-04-11 ENCOUNTER — Emergency Department (INDEPENDENT_AMBULATORY_CARE_PROVIDER_SITE_OTHER)
Admission: EM | Admit: 2015-04-11 | Discharge: 2015-04-11 | Disposition: A | Discharge status: Home / Self Care | Payer: 59 | Source: Home / Self Care | Attending: Family Medicine | Admitting: Family Medicine

## 2015-04-11 DIAGNOSIS — J020 Streptococcal pharyngitis: Secondary | ICD-10-CM

## 2015-04-11 DIAGNOSIS — B9789 Other viral agents as the cause of diseases classified elsewhere: Secondary | ICD-10-CM

## 2015-04-11 DIAGNOSIS — J069 Acute upper respiratory infection, unspecified: Secondary | ICD-10-CM

## 2015-04-11 LAB — POCT RAPID STREP A (OFFICE): RAPID STREP A SCREEN: POSITIVE — AB

## 2015-04-11 MED ORDER — IBUPROFEN 400 MG PO TABS
400.0000 mg | ORAL_TABLET | Freq: Once | ORAL | Status: AC
  Administered 2015-04-11: 400 mg via ORAL

## 2015-04-11 MED ORDER — PENICILLIN V POTASSIUM 500 MG PO TABS
ORAL_TABLET | ORAL | Status: DC
Start: 2015-04-11 — End: 2015-04-24

## 2015-04-11 NOTE — ED Provider Notes (Signed)
CSN: [id number]     Arrival date & time 04/11/15  1150 History   First MD Initiated Contact with Patient 04/11/15 1224     Chief Complaint  Patient presents with  . Sore Throat      HPI Comments: Patient complains of four day history of typical cold-like symptoms developing over several days,  including mild sore throat, sinus congestion, maylgias, fatigue, and cough.  The sore throat became worse last night.  She has a past history of recurring strep pharyngitis.  The history is provided by the patient.    Past Medical History  Diagnosis Date  . No significant past medical history   . Infection     UTI  . Medical history non-contributory    Past Surgical History  Procedure Laterality Date  . No past surgeries    . Tubal ligation Bilateral 02/19/2014    Procedure: POST PARTUM Bilateral Salpingectomies;  Surgeon: Konrad Felix, MD;  Location: [facility];  Service: Gynecology;  Laterality: Bilateral;   Family History  Problem Relation Age of Onset  . Hypertension Mother   . Hypertension Father   . Cancer Paternal Grandmother    Social History  Substance Use Topics  . Smoking status: Current Every Day Smoker -- 0.50 packs/day    Types: Cigarettes  . Smokeless tobacco: Never Used     Comment: Feb 2015  . Alcohol Use: No     Comment: Occassional Use   OB History    Gravida Para Term Preterm AB TAB SAB Ectopic Multiple Living   0 0 0 0 0 0 2     Review of Systems + sore throat + cough No pleuritic pain No wheezing + nasal congestion + post-nasal drainage No sinus pain/pressure No itchy/red eyes No earache No hemoptysis No SOB ? fever, + chills No nausea No vomiting No abdominal pain No diarrhea No urinary symptoms No skin rash + fatigue + myalgias No headache Used OTC meds without relief  Allergies  Review of patient's allergies indicates no known allergies.  Home Medications   Prior to Admission medications   Medication Sig Start Date End Date  Taking? Authorizing Provider  penicillin v potassium (VEETID) 500 MG tablet Take one tab by mouth twice daily for 10 days 04/11/15   Lattie Haw, MD   Meds Ordered and Administered this Visit   Medications  ibuprofen (ADVIL,MOTRIN) tablet 400 mg (400 mg Oral Given 04/11/15 1223)    BP 119/80 mmHg  Pulse 92  Temp(Src) 98.7 F (37.1 C) (Oral)  Resp 16  Ht  (1.676 m)  Wt 261 lb (118.389 kg)  BMI 42.15 kg/m2  SpO2 100%  LMP 03/16/2015 No data found.   Physical Exam Nursing notes and Vital Signs reviewed. Appearance:  Patient appears stated age, and in no acute distress.  Patient is obese (BMI 42.2) Eyes:  Pupils are equal, round, and reactive to light and accomodation.  Extraocular movement is intact.  Conjunctivae are not inflamed  Ears:  Canals normal.  Tympanic membranes normal.  Nose:  Congested turbinates.  No sinus tenderness.  Maxillary sinus tenderness is present.  Pharynx:  Erythematous and slightly swollen without obstruction.  Neck:  Supple.  Tender enlarged tonsillar nodes present.  Nontender enlarged posterior nodes are palpated bilaterally  Lungs:  Clear to auscultation.  Breath sounds are equal.  Moving air well. Heart:  Regular rate and rhythm without murmurs, rubs, or gallops.  Abdomen:  Nontender without masses or hepatosplenomegaly.  Bowel sounds are present.  No CVA or flank tenderness.  Extremities:  No edema.  Skin:  No rash present.   ED Course  Procedures  None    Labs Reviewed  POCT RAPID STREP A (OFFICE) - Abnormal; Notable for the following:    Rapid Strep A Screen Positive (*)    All other components within normal limits      MDM   1. Strep pharyngitis   2. Viral URI with cough    Begin PenVK  As cold symptoms develop, try the following: Take plain guaifenesin (  extended release tabs such as Mucinex) twice daily, with plenty of water, for cough and congestion.  May add Pseudoephedrine ( , one or two every 4 to 6 hours) for  sinus congestion.  Get adequate rest.   May use Afrin nasal spray (or generic oxymetazoline) twice daily for about 5 days and then discontinue.  Also recommend using saline nasal spray several times daily and saline nasal irrigation (AYR is a common brand).   Try warm salt water gargles for sore throat.  Stop all antihistamines for now, and other non-prescription cough/cold preparations. May take Ibuprofen , 4 tabs every 8 hours with food for sore throat, fever, etc. May take Delsym Cough Suppressant at bedtime for nighttime cough.  Follow-up with family doctor if not improving about 7 to10 days.    Lattie Haw, MD 04/11/15 1242

## 2015-04-11 NOTE — ED Notes (Signed)
Pt c/o sore throat x 2 days. H/o frequent strep. Given IBF for pain 8/10.

## 2015-04-11 NOTE — Discharge Instructions (Signed)
As cold symptoms develop, try the following: Take plain guaifenesin (  extended release tabs such as Mucinex) twice daily, with plenty of water, for cough and congestion.  May add Pseudoephedrine ( , one or two every 4 to 6 hours) for sinus congestion.  Get adequate rest.   May use Afrin nasal spray (or generic oxymetazoline) twice daily for about 5 days and then discontinue.  Also recommend using saline nasal spray several times daily and saline nasal irrigation (AYR is a common brand).   Try warm salt water gargles for sore throat.  Stop all antihistamines for now, and other non-prescription cough/cold preparations. May take Ibuprofen , 4 tabs every 8 hours with food for sore throat, fever, etc. May take Delsym Cough Suppressant at bedtime for nighttime cough.  Follow-up with family doctor if not improving about 7 to10 days.

## 2015-04-24 ENCOUNTER — Encounter: Payer: Self-pay | Admitting: Emergency Medicine

## 2015-04-24 ENCOUNTER — Emergency Department (INDEPENDENT_AMBULATORY_CARE_PROVIDER_SITE_OTHER)
Admission: EM | Admit: 2015-04-24 | Discharge: 2015-04-24 | Disposition: A | Discharge status: Home / Self Care | Payer: 59 | Source: Home / Self Care | Attending: Family Medicine | Admitting: Family Medicine

## 2015-04-24 DIAGNOSIS — R809 Proteinuria, unspecified: Secondary | ICD-10-CM

## 2015-04-24 DIAGNOSIS — M545 Low back pain, unspecified: Secondary | ICD-10-CM

## 2015-04-24 DIAGNOSIS — R319 Hematuria, unspecified: Secondary | ICD-10-CM | POA: Diagnosis not present

## 2015-04-24 DIAGNOSIS — R6889 Other general symptoms and signs: Secondary | ICD-10-CM | POA: Diagnosis not present

## 2015-04-24 LAB — POCT INFLUENZA A/B
Influenza A, POC: NEGATIVE
Influenza B, POC: NEGATIVE

## 2015-04-24 LAB — POCT URINALYSIS DIP (MANUAL ENTRY)
Glucose, UA: NEGATIVE
Leukocytes, UA: NEGATIVE
Nitrite, UA: NEGATIVE
Protein Ur, POC: 100 — AB
Spec Grav, UA: >=1.03
Urobilinogen, UA: 1
pH, UA: 6

## 2015-04-24 MED ORDER — SULFAMETHOXAZOLE-TRIMETHOPRIM 800-160 MG PO TABS: 1.0000 | ORAL_TABLET | Freq: Two times a day (BID) | ORAL | Status: AC

## 2015-04-24 MED ORDER — BENZONATATE 100 MG PO CAPS
100.0000 mg | ORAL_CAPSULE | Freq: Three times a day (TID) | ORAL | Status: DC
Start: 2015-04-24 — End: 2015-12-20

## 2015-04-24 MED ORDER — ALBUTEROL SULFATE HFA 108 (90 BASE) MCG/ACT IN AERS: 1.0000 | INHALATION_SPRAY | Freq: Four times a day (QID) | RESPIRATORY_TRACT | Status: DC | PRN

## 2015-04-24 MED ORDER — PREDNISONE 20 MG PO TABS: ORAL_TABLET | ORAL | Status: DC

## 2015-04-24 NOTE — ED Provider Notes (Signed)
CSN: [id number]     Arrival date & time 04/24/15  1500 History   First MD Initiated Contact with Patient 04/24/15 1508     Chief Complaint  Patient presents with  . URI   (Consider location/radiation/quality/duration/timing/severity/associated sxs/prior Treatment) HPI  The pt is a 30yo female presenting to [facility] with c/o 3 days of gradually worsening productive cough, chest congestion, and body aches. She also reports that her 2 sons tested positive for the flu last week and were given Tamiflu.  She did not get the flu vaccine this year. Denies hx of asthma. Denies SOB.  Denies n/v/d.  She also questions if she has a UTI as she has had Right lower back pain and urinary frequency for about 1 week. Denies dysuria or hematuria.    Past Medical History  Diagnosis Date  . No significant past medical history   . Infection     UTI  . Medical history non-contributory    Past Surgical History  Procedure Laterality Date  . No past surgeries    . Tubal ligation Bilateral 02/19/2014    Procedure: POST PARTUM Bilateral Salpingectomies;  Surgeon: Konrad Felix, MD;  Location: [facility];  Service: Gynecology;  Laterality: Bilateral;   Family History  Problem Relation Age of Onset  . Hypertension Mother   . Hypertension Father   . Cancer Paternal Grandmother    Social History  Substance Use Topics  . Smoking status: Current Every Day Smoker -- 0.50 packs/day    Types: Cigarettes  . Smokeless tobacco: Never Used     Comment: Feb 2015  . Alcohol Use: No     Comment: Occassional Use   OB History    Gravida Para Term Preterm AB TAB SAB Ectopic Multiple Living   0 0 0 0 0 0 2     Review of Systems  Constitutional: Positive for fever ( subjective) and chills.  HENT: Positive for congestion and rhinorrhea. Negative for sinus pressure and sore throat.   Respiratory: Positive for cough and wheezing. Negative for shortness of breath.   Cardiovascular: Positive for chest pain. Negative for  palpitations.  Gastrointestinal: Positive for abdominal pain ( generalized). Negative for nausea, vomiting and diarrhea.  Genitourinary: Positive for frequency. Negative for dysuria and flank pain.  Musculoskeletal: Positive for myalgias, back pain (Right lower) and arthralgias.  Neurological: Positive for headaches. Negative for dizziness and light-headedness.    Allergies  Review of patient's allergies indicates no known allergies.  Home Medications   Prior to Admission medications   Medication Sig Start Date End Date Taking? Authorizing Provider  albuterol (PROVENTIL HFA;VENTOLIN HFA) 108 (90 Base) MCG/ACT inhaler Inhale 1-2 puffs into the lungs every 6 (six) hours as needed for wheezing or shortness of breath. 04/24/15   Junius Finner, PA-C  benzonatate (TESSALON) 100 MG capsule Take 1-2 capsules (100-200 mg total) by mouth every 8 (eight) hours. 04/24/15   Junius Finner, PA-C  predniSONE (DELTASONE) 20 MG tablet 3 tabs po day one, then 2 po daily x 4 days 04/24/15   Junius Finner, PA-C  sulfamethoxazole-trimethoprim (BACTRIM DS,SEPTRA DS) 800-160 MG tablet Take 1 tablet by mouth 2 (two) times daily. 04/24/15 05/01/15  Junius Finner, PA-C   Meds Ordered and Administered this Visit  Medications - No data to display  BP 125/86 mmHg  Pulse 97  Temp(Src) 99 F (37.2 C) (Oral)  Ht  (1.676 m)  Wt 256 lb 8 oz (116.348 kg)  BMI 41.42 kg/m2  SpO2 99%  LMP 04/11/2015 No data found.   Physical Exam  Constitutional: She appears well-developed and well-nourished. No distress.  HENT:  Head: Normocephalic and atraumatic.  Right Ear: Tympanic membrane normal.  Left Ear: Tympanic membrane normal.  Nose: Rhinorrhea present.  Mouth/Throat: Uvula is midline and mucous membranes are normal. No oropharyngeal exudate, posterior oropharyngeal edema, posterior oropharyngeal erythema or tonsillar abscesses.  Eyes: Conjunctivae are normal. No scleral icterus.  Neck: Normal range of motion. Neck  supple.  Cardiovascular: Normal rate, regular rhythm and normal heart sounds.   Pulmonary/Chest: Effort normal. No respiratory distress. She has wheezes. She has no rales. She exhibits no tenderness.  Faint expiratory wheeze in lower lung fields. No respiratory distress.   Abdominal: Soft. She exhibits no distension and no mass. There is no tenderness. There is no rebound, no guarding and no CVA tenderness.  Musculoskeletal: Normal range of motion.  Neurological: She is alert.  Skin: Skin is warm and dry. She is not diaphoretic.  Nursing note and vitals reviewed.   ED Course  Procedures (including critical care time)  Labs Review Labs Reviewed  POCT URINALYSIS DIP (MANUAL ENTRY) - Abnormal; Notable for the following:    Color, UA brown (*)    Clarity, UA cloudy (*)    Bilirubin, UA small (*)    Ketones, POC UA trace (5) (*)    Blood, UA large (*)    Protein Ur, POC =100 (*)    All other components within normal limits  COMPREHENSIVE METABOLIC PANEL  POCT INFLUENZA A/B    Imaging Review No results found.     MDM   1. Flu-like symptoms   2. Hematuria   3. Proteinuria   4. Bilateral low back pain without sciatica     Pt c/o 3 days of flu-like symptoms. Exposure to flu.  Also c/o urinary frequency and lower back pain. Discussed with pt she could be treated for flu due to exposure from her 2 sons. Explained possibility of false negative results.  Pt would still like to be tested to make sure she doesn't have it.   UA: brown in color with trace ketones, trace bilirubin, protein, and large amount of blood.  Culture sent.  Pt denies hx of kidney infections, kidney stones, or kidney disease.  She has been eating and drinking less since being sick yesterday.    Rapid flu: Negative CBC and CMP pending as blood was unable to be drawn today. Advised to come back tomorrow and go downstairs for blood work.  Pt will be called for labs as they will likely take 2-3 days to result.    Rx: bactrim (while UA pending), Tessalon, prednisone, and albuterol.  F/u with PCP for recheck of symptoms within 1 week if not improving, and for urine recheck.  Discussed symptoms that warrant emergent care in the ED. Patient verbalized understanding and agreement with treatment plan.   Junius Finnerrin O'Malley, PA-C 04/24/15 1657

## 2015-04-24 NOTE — Discharge Instructions (Signed)
You may take 400-600mg  Ibuprofen (Motrin) every 6-8 hours for fever and pain  Alternate with Tylenol  You may take 500mg  Tylenol every 4-6 hours as needed for fever and pain  Follow-up with your primary care provider next week for recheck of symptoms if not improving.  Be sure to drink plenty of fluids and rest, at least 8hrs of sleep a night, preferably more while you are sick. Return urgent care or go to closest ER if you cannot keep down fluids/signs of dehydration, fever not reducing with Tylenol, difficulty breathing/wheezing, stiff neck, worsening condition, or other concerns (see below)  Please take antibiotics as prescribed and be sure to complete entire course even if you start to feel better to ensure infection does not come back, unless told otherwise by medical staff based on test results.

## 2015-04-24 NOTE — ED Notes (Signed)
Pt c/o productive cough, chest congestion, body aches x 3 days.

## 2015-04-25 LAB — COMPREHENSIVE METABOLIC PANEL
ALT: 11 U/L (ref 6–29)
AST: 14 U/L (ref 10–30)
Albumin: 3.9 g/dL (ref 3.6–5.1)
Alkaline Phosphatase: 62 U/L (ref 33–115)
BUN: 7 mg/dL (ref 7–25)
CO2: 29 mmol/L (ref 20–31)
Calcium: 9 mg/dL (ref 8.6–10.2)
Chloride: 103 mmol/L (ref 98–110)
Creat: 0.8 mg/dL (ref 0.50–1.10)
Glucose, Bld: 86 mg/dL (ref 65–99)
Potassium: 3.7 mmol/L (ref 3.5–5.3)
Sodium: 139 mmol/L (ref 135–146)
Total Bilirubin: 0.2 mg/dL (ref 0.2–1.2)
Total Protein: 7.2 g/dL (ref 6.1–8.1)

## 2015-04-26 ENCOUNTER — Telehealth: Payer: Self-pay | Admitting: *Deleted

## 2015-12-20 ENCOUNTER — Encounter: Payer: Self-pay | Admitting: *Deleted

## 2015-12-20 ENCOUNTER — Emergency Department (INDEPENDENT_AMBULATORY_CARE_PROVIDER_SITE_OTHER)
Admission: EM | Admit: 2015-12-20 | Discharge: 2015-12-20 | Disposition: A | Discharge status: Home / Self Care | Payer: 59 | Source: Home / Self Care | Attending: Family Medicine | Admitting: Family Medicine

## 2015-12-20 DIAGNOSIS — J40 Bronchitis, not specified as acute or chronic: Secondary | ICD-10-CM | POA: Diagnosis not present

## 2015-12-20 MED ORDER — BENZONATATE 200 MG PO CAPS: 200.0000 mg | ORAL_CAPSULE | Freq: Every day | ORAL | 0 refills | Status: DC

## 2015-12-20 MED ORDER — AZITHROMYCIN 250 MG PO TABS: ORAL_TABLET | ORAL | 0 refills | Status: DC

## 2015-12-20 NOTE — Discharge Instructions (Signed)
Take plain guaifenesin (1200mg  extended release tabs such as Mucinex) twice daily, with plenty of water, for cough and congestion.  May add Pseudoephedrine (30mg , one or two every 4 to 6 hours) for sinus congestion.  Get adequate rest.   Stop all antihistamines for now, and other non-prescription cough/cold preparations. May take Ibuprofen 200mg , 4 tabs every 8 hours with food for chest/sternum discomfort.   Follow-up with family doctor if not improving about10 days.

## 2015-12-20 NOTE — ED Triage Notes (Signed)
Pt c/o nonproductive cough x 1 wk. Denies fever. She felt better over the weekend, but cough returned yesterday.

## 2015-12-20 NOTE — ED Provider Notes (Signed)
[name]    CSN: [id number] Arrival date & time: 12/20/15  1655     History   Chief Complaint Chief Complaint  Patient presents with  . Cough    HPI Madison Davila is a 30 y.o. female.   About 10 days ago patient developed typical cold-like symptoms developing over several days, including mild sore throat, sinus congestion, headache, fatigue, and cough.  Her symptoms improved considerably, and then two days ago her cough increased with recurrent chills and fatigue.  She complains of tightness in her anterior chest.   The history is provided by the patient.    Past Medical History:  Diagnosis Date  . Infection    UTI  . Medical history non-contributory   . No significant past medical history     Patient Active Problem List   Diagnosis Date Noted  . Belly pain 02/28/2014  . Vaginal delivery 02/20/2014  . History of bilateral tubal ligation 02/20/2014  . Morbid obesity (HCC) 02/12/2014  . Positive GBS test 02/12/2014    Past Surgical History:  Procedure Laterality Date  . NO PAST SURGERIES    . TUBAL LIGATION Bilateral 02/19/2014   Procedure: POST PARTUM Bilateral Salpingectomies;  Surgeon: Konrad Felix, MD;  Location: [facility];  Service: Gynecology;  Laterality: Bilateral;    OB History    Gravida Para Term Preterm AB Living   2 2 2  0 0 2   SAB TAB Ectopic Multiple Live Births   0 0 0 0 2       Home Medications    Prior to Admission medications   Medication Sig Start Date End Date Taking? Authorizing Provider  azithromycin (ZITHROMAX Z-PAK) 250 MG tablet Take 2 tabs today; then begin one tab once daily for 4 more days. 12/20/15   Lattie Haw, MD  benzonatate (TESSALON) 200 MG capsule Take 1 capsule (200 mg total) by mouth at bedtime. Take as needed for cough 12/20/15   Lattie Haw, MD    Family History Family History  Problem Relation Age of Onset  . Cancer Paternal Grandmother   . Hypertension Mother   . Hypertension Father       Social History Social History  Substance Use Topics  . Smoking status: Former Smoker    Packs/day: 0.50    Types: Cigarettes    Quit date: 12/06/2015  . Smokeless tobacco: Never Used     Comment: Feb 2015  . Alcohol use No     Comment: Occassional Use     Allergies   Patient has no known allergies.   Review of Systems Review of Systems Minimal sore throat + cough No pleuritic pain, but has tightness in anterior chest. + wheezing No nasal congestion ? post-nasal drainage No sinus pain/pressure No itchy/red eyes No earache No hemoptysis No SOB No fever, + chills/sweats No nausea No vomiting No abdominal pain No diarrhea No urinary symptoms No skin rash + fatigue + myalgias No headache Used OTC meds without relief   Physical Exam Triage Vital Signs ED Triage Vitals  Enc Vitals Group     BP 12/20/15 1704 124/85     Pulse Rate 12/20/15 1704 87     Resp 12/20/15 1704 18     Temp 12/20/15 1704 99.5 F (37.5 C)     Temp Source 12/20/15 1704 Oral     SpO2 12/20/15 1704 100 %     Weight 12/20/15 1704 241 lb (109.3 kg)     Height  12/20/15 1704 5' 6.5" (1.689 m)     Head Circumference --      Peak Flow --      Pain Score 12/20/15 1706 0     Pain Loc --      Pain Edu? --      Excl. in GC? --    No data found.   Updated Vital Signs BP 124/85 (BP Location: Left Arm)   Pulse 87   Temp 99.5 F (37.5 C) (Oral)   Resp 18   Ht 5' 6.5" (1.689 m)   Wt 241 lb (109.3 kg)   LMP 12/12/2015   SpO2 100%   BMI 38.32 kg/m   Visual Acuity Right Eye Distance:   Left Eye Distance:   Bilateral Distance:    Right Eye Near:   Left Eye Near:    Bilateral Near:     Physical Exam Nursing notes and Vital Signs reviewed. Appearance:  Patient appears stated age, and in no acute distress Eyes:  Pupils are equal, round, and reactive to light and accomodation.  Extraocular movement is intact.  Conjunctivae are not inflamed  Ears:  Canals normal.  Tympanic  membranes normal.  Nose:  Mildly congested turbinates.  No sinus tenderness.    Pharynx:  Normal Neck:  Supple.  Tender enlarged posterior/lateral nodes are palpated bilaterally  Lungs:  Clear to auscultation.  Breath sounds are equal.  Moving air well. Chest:  Distinct tenderness to palpation over the mid-sternum.  Heart:  Regular rate and rhythm without murmurs, rubs, or gallops.  Abdomen:  Nontender without masses or hepatosplenomegaly.  Bowel sounds are present.  No CVA or flank tenderness.  Extremities:  No edema.  Skin:  No rash present.    UC Treatments / Results  Labs (all labs ordered are listed, but only abnormal results are displayed) Labs Reviewed - No data to display  EKG  EKG Interpretation None       Radiology No results found.  Procedures Procedures (including critical care time)  Medications Ordered in UC Medications - No data to display   Initial Impression / Assessment and Plan / UC Course  I have reviewed the triage vital signs and the nursing notes.  Pertinent labs & imaging results that were available during my care of the patient were reviewed by me and considered in my medical decision making (see chart for details).  Clinical Course   Suspect developing bacterial bronchitis with new onset of chills and increasing cough. Begin Z-pak for atypical coverage. Prescription written for Benzonatate [facility](Tessalon) to take at bedtime for night-time cough.  Take plain guaifenesin (1200mg  extended release tabs such as Mucinex) twice daily, with plenty of water, for cough and congestion.  May add Pseudoephedrine (30mg , one or two every 4 to 6 hours) for sinus congestion.  Get adequate rest.   Stop all antihistamines for now, and other non-prescription cough/cold preparations. May take Ibuprofen 200mg , 4 tabs every 8 hours with food for chest/sternum discomfort.   Follow-up with family doctor if not improving about10 days.      Final Clinical Impressions(s) /  UC Diagnoses   Final diagnoses:  Bronchitis    New Prescriptions New Prescriptions   AZITHROMYCIN (ZITHROMAX Z-PAK) 250 MG TABLET    Take 2 tabs today; then begin one tab once daily for 4 more days.   BENZONATATE (TESSALON) 200 MG CAPSULE    Take 1 capsule (200 mg total) by mouth at bedtime. Take as needed for cough  Lattie HawStephen A Teodoro Jeffreys, MD 12/20/15 1740

## 2016-02-11 ENCOUNTER — Emergency Department (HOSPITAL_COMMUNITY)
Admission: EM | Admit: 2016-02-11 | Discharge: 2016-02-11 | Disposition: A | Discharge status: Home / Self Care | Payer: 59 | Attending: Emergency Medicine | Admitting: Emergency Medicine

## 2016-02-11 ENCOUNTER — Emergency Department (HOSPITAL_COMMUNITY): Payer: 59

## 2016-02-11 ENCOUNTER — Encounter (HOSPITAL_COMMUNITY): Payer: Self-pay | Admitting: Emergency Medicine

## 2016-02-11 DIAGNOSIS — R0789 Other chest pain: Secondary | ICD-10-CM | POA: Insufficient documentation

## 2016-02-11 DIAGNOSIS — Z87891 Personal history of nicotine dependence: Secondary | ICD-10-CM | POA: Diagnosis not present

## 2016-02-11 DIAGNOSIS — Z5321 Procedure and treatment not carried out due to patient leaving prior to being seen by health care provider: Secondary | ICD-10-CM | POA: Insufficient documentation

## 2016-02-11 LAB — CBC
HEMATOCRIT: 36.2 % (ref 36.0–46.0)
HEMOGLOBIN: 11.5 g/dL — AB (ref 12.0–15.0)
MCH: 26.6 pg (ref 26.0–34.0)
MCHC: 31.8 g/dL (ref 30.0–36.0)
MCV: 83.8 fL (ref 78.0–100.0)
Platelets: 275 10*3/uL (ref 150–400)
RBC: 4.32 MIL/uL (ref 3.87–5.11)
RDW: 13.4 % (ref 11.5–15.5)
WBC: 5.8 10*3/uL (ref 4.0–10.5)

## 2016-02-11 LAB — BASIC METABOLIC PANEL
ANION GAP: 7 (ref 5–15)
BUN: 8 mg/dL (ref 6–20)
CO2: 26 mmol/L (ref 22–32)
Calcium: 8.9 mg/dL (ref 8.9–10.3)
Chloride: 105 mmol/L (ref 101–111)
Creatinine, Ser: 0.75 mg/dL (ref 0.44–1.00)
GFR calc Af Amer: >60 mL/min (ref >60)
GFR calc non Af Amer: >60 mL/min (ref >60)
GLUCOSE: 95 mg/dL (ref 65–99)
POTASSIUM: 3.6 mmol/L (ref 3.5–5.1)
Sodium: 138 mmol/L (ref 135–145)

## 2016-02-11 LAB — I-STAT TROPONIN, ED: Troponin i, poc: 0 ng/mL (ref 0.00–0.08)

## 2016-02-11 NOTE — ED Notes (Signed)
Pt stated she was leaving due to wait, Pt encouraged to stay. Pt was seen walking out the lobby.

## 2016-02-11 NOTE — ED Triage Notes (Signed)
Patient reports intermittent central chest pain/tightness radiating to left arm and mid back , denies emesis or diaphoresis . No fever or chills .

## 2016-06-21 ENCOUNTER — Encounter: Payer: Self-pay | Admitting: Emergency Medicine

## 2016-06-21 ENCOUNTER — Emergency Department (INDEPENDENT_AMBULATORY_CARE_PROVIDER_SITE_OTHER)
Admission: EM | Admit: 2016-06-21 | Discharge: 2016-06-21 | Disposition: A | Discharge status: Home / Self Care | Payer: 59 | Source: Home / Self Care | Attending: Family Medicine | Admitting: Family Medicine

## 2016-06-21 DIAGNOSIS — S0502XA Injury of conjunctiva and corneal abrasion without foreign body, left eye, initial encounter: Secondary | ICD-10-CM

## 2016-06-21 DIAGNOSIS — Z973 Presence of spectacles and contact lenses: Secondary | ICD-10-CM

## 2016-06-21 MED ORDER — GENTAMICIN SULFATE 0.3 % OP SOLN: 1.0000 [drp] | OPHTHALMIC | 0 refills | Status: DC

## 2016-06-21 NOTE — ED Provider Notes (Signed)
CSN: [id number]     Arrival date & time 06/21/16  1232 History   First MD Initiated Contact with Patient 06/21/16 1254     Chief Complaint  Patient presents with  . Eye Problem   (Consider location/radiation/quality/duration/timing/severity/associated sxs/prior Treatment) HPI Madison Davila is a 31 y.o. female presenting to UC with c/o Left eye pain, redness, and burning after having eyelash extension placed on.  Right eye feels fine.  She does wear contacts. Denies change in vision.  Denies known trauma to eye. No recent illness. No discharge from eye.    Past Medical History:  Diagnosis Date  . Infection    UTI  . Medical history non-contributory   . No significant past medical history    Past Surgical History:  Procedure Laterality Date  . NO PAST SURGERIES    . TUBAL LIGATION Bilateral 02/19/2014   Procedure: POST PARTUM Bilateral Salpingectomies;  Surgeon: Konrad FelixEma Wakuru Kulwa, MD;  Location: [facility];  Service: Gynecology;  Laterality: Bilateral;   Family History  Problem Relation Age of Onset  . Cancer Paternal Grandmother   . Hypertension Mother   . Hypertension Father    Social History  Substance Use Topics  . Smoking status: Former Smoker    Packs/day: 0.50    Types: Cigarettes    Quit date: 12/06/2015  . Smokeless tobacco: Never Used     Comment: Feb 2015  . Alcohol use No     Comment: Occassional Use   OB History    Gravida Para Term Preterm AB Living   2 2 2  0 0 2   SAB TAB Ectopic Multiple Live Births   0 0 0 0 2     Review of Systems  HENT: Negative for congestion and rhinorrhea.   Eyes: Positive for pain, discharge (watery) and redness. Negative for photophobia, itching and visual disturbance.    Allergies  Patient has no known allergies.  Home Medications   Prior to Admission medications   Medication Sig Start Date End Date Taking? Authorizing Provider  gentamicin (GARAMYCIN) 0.3 % ophthalmic solution Place 1 drop into the left eye every 4 (four)  hours. For 7 days 06/21/16   Junius Finner'Malley, Hali Balgobin, PA-C   Meds Ordered and Administered this Visit  Medications - No data to display  BP 117/77 (BP Location: Left Arm)   Pulse 84   Temp 98.4 F (36.9 C) (Oral)   Ht 5\' 6"  (1.676 m)   Wt 240 lb (108.9 kg)   LMP 05/30/2016 (Exact Date)   SpO2 95%   BMI 38.74 kg/m  No data found.   Physical Exam  Constitutional: She is oriented to person, place, and time. She appears well-developed and well-nourished. No distress.  HENT:  Head: Normocephalic and atraumatic.  Eyes: EOM and lids are normal. Pupils are equal, round, and reactive to light. Lids are everted and swept, no foreign bodies found. Left eye exhibits no discharge. No foreign body present in the left eye. Left conjunctiva is injected.    Corneal abrasion in Left eye, medial aspect. No surrounding erythema, edema, or tenderness. No discharge.   Neck: Normal range of motion.  Cardiovascular: Normal rate.   Pulmonary/Chest: Effort normal.  Musculoskeletal: Normal range of motion.  Neurological: She is alert and oriented to person, place, and time.  Skin: Skin is warm and dry. She is not diaphoretic.  Psychiatric: She has a normal mood and affect. Her behavior is normal.  Nursing note and vitals reviewed.   Urgent Care  Course     Procedures (including critical care time)  Labs Review Labs Reviewed - No data to display  Imaging Review No results found.   Visual Acuity Review  Right Eye Distance:  (20/15) Left Eye Distance: 20/20 Bilateral Distance: 20/15 (with correction)   MDM   1. Corneal abrasion, left, initial encounter   2. Wears contact lenses    Hx and exam c/w Left eye corneal abrasion w/o evidence of periorbital cellulitis.   Rx: gentamicin ophthalmic drops Home care instructions provided. f/u in 3-4 days if not improving, sooner if worsening.     Junius Finner, PA-C 06/21/16 1623

## 2016-06-21 NOTE — ED Triage Notes (Signed)
Rt eye pain, redness, burns started yesterday after having lash extensions placed. Right eye isnt bothering her.

## 2016-06-23 ENCOUNTER — Telehealth: Payer: Self-pay | Admitting: Emergency Medicine

## 2016-06-23 NOTE — Telephone Encounter (Signed)
Patient reports she is feeling better. Advised to call back with questions or concerns.

## 2016-06-29 ENCOUNTER — Encounter (HOSPITAL_COMMUNITY): Payer: Self-pay

## 2016-06-29 ENCOUNTER — Emergency Department (HOSPITAL_COMMUNITY)
Admission: EM | Admit: 2016-06-29 | Discharge: 2016-06-29 | Disposition: A | Discharge status: Home / Self Care | Payer: 59 | Attending: Emergency Medicine | Admitting: Emergency Medicine

## 2016-06-29 ENCOUNTER — Emergency Department (HOSPITAL_COMMUNITY): Payer: 59

## 2016-06-29 DIAGNOSIS — Z79899 Other long term (current) drug therapy: Secondary | ICD-10-CM | POA: Diagnosis not present

## 2016-06-29 DIAGNOSIS — R002 Palpitations: Secondary | ICD-10-CM | POA: Insufficient documentation

## 2016-06-29 DIAGNOSIS — Z87891 Personal history of nicotine dependence: Secondary | ICD-10-CM | POA: Diagnosis not present

## 2016-06-29 DIAGNOSIS — F419 Anxiety disorder, unspecified: Secondary | ICD-10-CM | POA: Diagnosis not present

## 2016-06-29 DIAGNOSIS — R0789 Other chest pain: Secondary | ICD-10-CM | POA: Diagnosis present

## 2016-06-29 LAB — CBC
HCT: 35.9 % — ABNORMAL LOW (ref 36.0–46.0)
Hemoglobin: 11.4 g/dL — ABNORMAL LOW (ref 12.0–15.0)
MCH: 26.7 pg (ref 26.0–34.0)
MCHC: 31.8 g/dL (ref 30.0–36.0)
MCV: 84.1 fL (ref 78.0–100.0)
PLATELETS: 271 10*3/uL (ref 150–400)
RBC: 4.27 MIL/uL (ref 3.87–5.11)
RDW: 13.4 % (ref 11.5–15.5)
WBC: 5.8 10*3/uL (ref 4.0–10.5)

## 2016-06-29 LAB — BASIC METABOLIC PANEL
Anion gap: 5 (ref 5–15)
BUN: 7 mg/dL (ref 6–20)
CALCIUM: 8.7 mg/dL — AB (ref 8.9–10.3)
CHLORIDE: 107 mmol/L (ref 101–111)
CO2: 25 mmol/L (ref 22–32)
CREATININE: 0.66 mg/dL (ref 0.44–1.00)
GFR calc Af Amer: >60 mL/min (ref >60)
GFR calc non Af Amer: >60 mL/min (ref >60)
Glucose, Bld: 110 mg/dL — ABNORMAL HIGH (ref 65–99)
Potassium: 3.8 mmol/L (ref 3.5–5.1)
SODIUM: 137 mmol/L (ref 135–145)

## 2016-06-29 LAB — D-DIMER, QUANTITATIVE: D-Dimer, Quant: 0.38 ug/mL-FEU (ref 0.00–0.50)

## 2016-06-29 LAB — I-STAT TROPONIN, ED: TROPONIN I, POC: 0 ng/mL (ref 0.00–0.08)

## 2016-06-29 LAB — I-STAT BETA HCG BLOOD, ED (MC, WL, AP ONLY): I-stat hCG, quantitative: <5 m[IU]/mL (ref <5)

## 2016-06-29 NOTE — ED Notes (Signed)
Pt departed in NAD.  

## 2016-06-29 NOTE — ED Triage Notes (Signed)
Pt endorses off and on palpitations for "years" Pt states she has some anxiety but also has occasional chest pain with the palpitations. States that palpitations don't come in correlation with stressful situations but are random in nature. VSS.

## 2016-06-29 NOTE — Discharge Instructions (Signed)
Your chest pain and palpitations are likely due to anxiety. There is no evidence of heart attack or blood clot in the lung. Follow-up with a primary care physician. Return to the ED if you develop new or worsening symptoms.

## 2016-06-29 NOTE — ED Provider Notes (Signed)
[facility]-EMERGENCY DEPT Provider Note   CSN: [id number] Arrival date & time: 06/29/16  0023  By signing my name below, I, Karren Cobble, attest that this documentation has been prepared under the direction and in the presence of Xinyi Batton, Jeannett Senior, MD. Electronically Signed: Karren Cobble, ED Scribe. 06/29/16. 1:59 AM.  History   Chief Complaint Chief Complaint  Patient presents with  . Palpitations   The history is provided by the patient. No language interpreter was used.    HPI Comments: Madison Davila is a 31 y.o. female with no pertinent PMHx, who presents to the Emergency Department complaining intermittent episodes of increased heart rate, which she describes as "racing" that worsened tonight PTA. Her associated symptoms include sharp and pressure- like chest pain and chest tightness. She notes having these episodes once in a while. She believes the symptoms are secondary to anxiety. Pt has not been clinically diagnosed  with anxiety, nor is she prescribed medication, but her and her mother believe this is the issue. Denies tachycardia or shortness of breath at this time. Not currently in any pain. No treatment tried prior to arrival. Not currently on [facility]. No tobacco usage. Ocassional ETOH usage.  Denies suicidal or homicidal ideations. No other acute associated symptoms at this time.     Past Medical History:  Diagnosis Date  . Infection    UTI  . Medical history non-contributory   . No significant past medical history     Patient Active Problem List   Diagnosis Date Noted  . Belly pain 02/28/2014  . Vaginal delivery 02/20/2014  . History of bilateral tubal ligation 02/20/2014  . Morbid obesity (HCC) 02/12/2014  . Positive GBS test 02/12/2014    Past Surgical History:  Procedure Laterality Date  . NO PAST SURGERIES    . TUBAL LIGATION Bilateral 02/19/2014   Procedure: POST PARTUM Bilateral Salpingectomies;  Surgeon: Konrad Felix, MD;  Location: [facility];  Service: Gynecology;   Laterality: Bilateral;    OB History    Gravida Para Term Preterm AB Living   2 2 2  0 0 2   SAB TAB Ectopic Multiple Live Births   0 0 0 0 2       Home Medications    Prior to Admission medications   Medication Sig Start Date End Date Taking? Authorizing Provider  gentamicin (GARAMYCIN) 0.3 % ophthalmic solution Place 1 drop into the left eye every 4 (four) hours. For 7 days 06/21/16   Junius Finner, PA-C    Family History Family History  Problem Relation Age of Onset  . Cancer Paternal Grandmother   . Hypertension Mother   . Hypertension Father     Social History Social History  Substance Use Topics  . Smoking status: Former Smoker    Packs/day: 0.50    Types: Cigarettes    Quit date: 12/06/2015  . Smokeless tobacco: Never Used     Comment: Feb 2015  . Alcohol use No     Comment: Occassional Use     Allergies   Patient has no known allergies.   Review of Systems Review of Systems  Respiratory: Positive for chest tightness.   Cardiovascular: Positive for chest pain.  Psychiatric/Behavioral: Negative for suicidal ideas.       No homicidal ideations.    A complete 10 system review of systems was obtained and all systems are negative except as noted in the HPI and PMH.    Physical Exam Updated Vital Signs BP 121/84 (BP Location: Left  Arm)   Pulse 81   Temp 98.4 F (36.9 C) (Oral)   Resp 16   Ht 5\' 7"  (1.702 m)   Wt 238 lb (108 kg)   LMP 06/29/2016 (Exact Date)   SpO2 98%   Breastfeeding? No   BMI 37.28 kg/m   Physical Exam  Constitutional: She is oriented to person, place, and time. She appears well-developed and well-nourished. No distress.  HENT:  Head: Normocephalic and atraumatic.  Mouth/Throat: Oropharynx is clear and moist. No oropharyngeal exudate.  Eyes: Conjunctivae and EOM are normal. Pupils are equal, round, and reactive to light.  Neck: Normal range of motion. Neck supple.  No meningismus.  Cardiovascular: Normal rate, regular  rhythm, normal heart sounds and intact distal pulses.   No murmur heard. Pulmonary/Chest: Effort normal and breath sounds normal. No respiratory distress. She exhibits tenderness.  Chest wall tender with palpation.   Abdominal: Soft. There is no tenderness. There is no rebound and no guarding.  Musculoskeletal: Normal range of motion. She exhibits no edema or tenderness.  Neurological: She is alert and oriented to person, place, and time. No cranial nerve deficit. She exhibits normal muscle tone. Coordination normal.   5/5 strength throughout. CN 2-12 intact.Equal grip strength.   Skin: Skin is warm.  Psychiatric: She has a normal mood and affect. Her behavior is normal.  Nursing note and vitals reviewed.    ED Treatments / Results  DIAGNOSTIC STUDIES: Oxygen Saturation is 98% on RA, normal by my interpretation.   COORDINATION OF CARE: 1:41 AM-Discussed next steps with pt. Pt verbalized understanding and is agreeable with the plan. '  Labs (all labs ordered are listed, but only abnormal results are displayed) Labs Reviewed  BASIC METABOLIC PANEL - Abnormal; Notable for the following:       Result Value   Glucose, Bld 110 (*)    Calcium 8.7 (*)    All other components within normal limits  CBC - Abnormal; Notable for the following:    Hemoglobin 11.4 (*)    HCT 35.9 (*)    All other components within normal limits  D-DIMER, QUANTITATIVE (NOT [facility])  TSH  I-STAT TROPOININ, ED  I-STAT BETA HCG BLOOD, ED ([facility], [facility], [facility] ONLY)    EKG  EKG Interpretation  Date/Time:  Friday Jun 29 2016 00:43:29 EDT Ventricular Rate:  67 PR Interval:  140 QRS Duration: 88 QT Interval:  414 QTC Calculation: 437 R Axis:   72 Text Interpretation:  Normal sinus rhythm Normal ECG No significant change was found Confirmed by Glynn Octaveancour, Theodis Kinsel [phone]([id number]) on 06/29/2016 1:41:01 AM       Radiology Dg Chest 2 View  Result Date: 06/29/2016 CLINICAL DATA:  Palpitations and chest tightness EXAM: CHEST   2 VIEW COMPARISON:  Chest radiograph 02/11/2016 FINDINGS: The heart size and mediastinal contours are within normal limits. Both lungs are clear. The visualized skeletal structures are unremarkable. IMPRESSION: No active cardiopulmonary disease. Electronically Signed   By: Deatra RobinsonKevin  Herman M.D.   On: 06/29/2016 01:14    Procedures Procedures (including critical care time)  Medications Ordered in ED Medications - No data to display   Initial Impression / Assessment and Plan / ED Course  I have reviewed the triage vital signs and the nursing notes.  Pertinent labs & imaging results that were available during my care of the patient were reviewed by me and considered in my medical decision making (see chart for details).     Patient believes she is has issues  with anxiety but has never been diagnosed. Endorses episodes of palpitations with rapid heartbeat and intermittent chest pain lasts for a few seconds at a time. Denies any shortness of breath, diaphoresis, nausea or vomiting.  EKG is normal sinus rhythm. She is perc negative.  Chest x-rays negative. Labs are reassuring. Negative troponin and d-dimer.  Low suspicion for ACS or PE. Symptoms likely due to anxiety. Needs to establish care with PCP. Return precautions discussed.  Final Clinical Impressions(s) / ED Diagnoses   Final diagnoses:  Palpitations  Anxiety    New Prescriptions New Prescriptions   No medications on file   I personally performed the services described in this documentation, which was scribed in my presence. The recorded information has been reviewed and is accurate.     Glynn Octave, MD 06/29/16 [phone]

## 2016-11-23 ENCOUNTER — Emergency Department (INDEPENDENT_AMBULATORY_CARE_PROVIDER_SITE_OTHER)
Admission: EM | Admit: 2016-11-23 | Discharge: 2016-11-23 | Disposition: A | Discharge status: Home / Self Care | Payer: 59 | Source: Home / Self Care

## 2016-11-23 ENCOUNTER — Emergency Department (INDEPENDENT_AMBULATORY_CARE_PROVIDER_SITE_OTHER): Payer: 59

## 2016-11-23 ENCOUNTER — Encounter: Payer: Self-pay | Admitting: Emergency Medicine

## 2016-11-23 DIAGNOSIS — N390 Urinary tract infection, site not specified: Secondary | ICD-10-CM | POA: Diagnosis not present

## 2016-11-23 DIAGNOSIS — M546 Pain in thoracic spine: Secondary | ICD-10-CM | POA: Diagnosis not present

## 2016-11-23 LAB — POCT URINALYSIS DIP (MANUAL ENTRY)
Bilirubin, UA: NEGATIVE
GLUCOSE UA: NEGATIVE mg/dL
Ketones, POC UA: NEGATIVE mg/dL
NITRITE UA: NEGATIVE
Protein Ur, POC: NEGATIVE mg/dL
Spec Grav, UA: 1.02 (ref 1.010–1.025)
UROBILINOGEN UA: 0.2 U/dL
pH, UA: 7 (ref 5.0–8.0)

## 2016-11-23 MED ORDER — METHOCARBAMOL 500 MG PO TABS: 500.0000 mg | ORAL_TABLET | Freq: Two times a day (BID) | ORAL | 0 refills | Status: DC

## 2016-11-23 MED ORDER — CYCLOBENZAPRINE HCL 10 MG PO TABS: 10.0000 mg | ORAL_TABLET | Freq: Two times a day (BID) | ORAL | 0 refills | Status: DC | PRN

## 2016-11-23 MED ORDER — CEPHALEXIN 500 MG PO CAPS: 500.0000 mg | ORAL_CAPSULE | Freq: Four times a day (QID) | ORAL | 0 refills | Status: DC

## 2016-11-23 NOTE — Discharge Instructions (Signed)
Return if any problems.

## 2016-11-23 NOTE — ED Triage Notes (Signed)
Reports upper back pain for past 3 months. Lower back pain and intermittent left side pain for 2 1/2 weeks with frequency and urgency.

## 2016-11-24 LAB — URINE CULTURE
MICRO NUMBER:: 81140615
RESULT: NO GROWTH
SPECIMEN QUALITY: ADEQUATE

## 2016-11-24 NOTE — ED Provider Notes (Signed)
[facility]    CSN: [id number] Arrival date & time: 11/23/16  1734     History   Chief Complaint Chief Complaint  Patient presents with  . Back Pain  . Urinary Frequency  . Rectal Urgency  . Flank Pain    left    HPI Madison Davila is a 31 y.o. female.   The history is provided by the patient. No language interpreter was used.  Back Pain  Location:  Thoracic spine Quality:  Aching Radiates to:  Does not radiate Pain severity:  Moderate Pain is:  Same all the time Onset quality:  Gradual Duration:  3 months Timing:  Constant Progression:  Worsening Chronicity:  Chronic Relieved by:  Nothing Worsened by:  Nothing Ineffective treatments:  None tried Associated symptoms: dysuria   Urinary Frequency   Flank Pain   Pt reports she has had burning with urination for over 2 weeks.  Pt is tearful.  Pt is worried that she has lung cancer.  Because of pain in her back  Past Medical History:  Diagnosis Date  . Infection    UTI  . Medical history non-contributory   . No significant past medical history     Patient Active Problem List   Diagnosis Date Noted  . Belly pain 02/28/2014  . Vaginal delivery 02/20/2014  . History of bilateral tubal ligation 02/20/2014  . Morbid obesity (HCC) 02/12/2014  . Positive GBS test 02/12/2014    Past Surgical History:  Procedure Laterality Date  . NO PAST SURGERIES    . TUBAL LIGATION Bilateral 02/19/2014   Procedure: POST PARTUM Bilateral Salpingectomies;  Surgeon: Konrad Felix, MD;  Location: [facility];  Service: Gynecology;  Laterality: Bilateral;    OB History    Gravida Para Term Preterm AB Living   0 0 2   SAB TAB Ectopic Multiple Live Births   0 0 0 0 2       Home Medications    Prior to Admission medications   Medication Sig Start Date End Date Taking? Authorizing Provider  cephALEXin (KEFLEX) 500 MG capsule Take 1 capsule (500 mg total) by mouth 4 (four) times daily. 11/23/16   Elson Areas, PA-C  cyclobenzaprine (FLEXERIL) 10 MG tablet Take 1 tablet (10 mg total) by mouth 2 (two) times daily as needed for muscle spasms. 11/23/16   Elson Areas, PA-C  gentamicin (GARAMYCIN) 0.3 % ophthalmic solution Place 1 drop into the left eye every 4 (four) hours. For 7 days Patient not taking: Reported on 06/29/2016 06/21/16   Lurene Shadow, PA-C    Family History Family History  Problem Relation Age of Onset  . Cancer Paternal Grandmother   . Hypertension Mother   . Hypertension Father     Social History Social History  Substance Use Topics  . Smoking status: Former Smoker    Packs/day: 0.50    Types: Cigarettes    Quit date: 12/06/2015  . Smokeless tobacco: Never Used     Comment: Feb 2015  . Alcohol use No     Comment: Occassional Use     Allergies   Patient has no known allergies.   Review of Systems Review of Systems  Genitourinary: Positive for dysuria, flank pain and frequency.  Musculoskeletal: Positive for back pain.  All other systems reviewed and are negative.    Physical Exam Triage Vital Signs ED Triage Vitals  Enc Vitals Group     BP 11/23/16 1810  119/78     Pulse Rate 11/23/16 1810 71     Resp 11/23/16 1810 16     Temp 11/23/16 1810 98.6 F (37 C)     Temp Source 11/23/16 1810 Oral     SpO2 11/23/16 1810 100 %     Weight 11/23/16 1811 240 lb (108.9 kg)     Height 11/23/16 1811  (1.702 m)     Head Circumference --      Peak Flow --      Pain Score 11/23/16 1811 5     Pain Loc --      Pain Edu? --      Excl. in GC? --    No data found.   Updated Vital Signs BP 119/78 (BP Location: Left Arm)   Pulse 71   Temp 98.6 F (37 C) (Oral)   Resp 16   Ht  (1.702 m)   Wt 240 lb (108.9 kg)   LMP 11/06/2016 (Exact Date)   SpO2 100%   BMI 37.59 kg/m   Visual Acuity Right Eye Distance:   Left Eye Distance:   Bilateral Distance:    Right Eye Near:   Left Eye Near:    Bilateral Near:     Physical Exam    Constitutional: She appears well-developed and well-nourished. No distress.  HENT:  Head: Normocephalic and atraumatic.  Eyes: Conjunctivae are normal.  Neck: Neck supple.  Cardiovascular: Normal rate and regular rhythm.   No murmur heard. Pulmonary/Chest: Effort normal and breath sounds normal. No respiratory distress.  Abdominal: Soft. There is no tenderness.  Musculoskeletal: She exhibits no edema.  Neurological: She is alert.  Skin: Skin is warm and dry.  Psychiatric: She has a normal mood and affect.  Nursing note and vitals reviewed.    UC Treatments / Results  Labs (all labs ordered are listed, but only abnormal results are displayed) Labs Reviewed  POCT URINALYSIS DIP (MANUAL ENTRY) - Abnormal; Notable for the following:       Result Value   Color, UA light yellow (*)    Clarity, UA hazy (*)    Blood, UA trace-intact (*)    Leukocytes, UA Small (1+) (*)    All other components within normal limits  URINE CULTURE    EKG  EKG Interpretation None       Radiology Dg Chest 2 View  Result Date: 11/23/2016 CLINICAL DATA:  Three months nontraumatic upper back pain. EXAM: CHEST  2 VIEW COMPARISON:  06/29/2016 FINDINGS: The lungs are clear. The pulmonary vasculature is normal. Heart size is normal. Hilar and mediastinal contours are unremarkable. There is no pleural effusion. IMPRESSION: No active cardiopulmonary disease. Electronically Signed   By: Ellery Plunk M.D.   On: 11/23/2016 19:06    Procedures Procedures (including critical care time)  Medications Ordered in UC Medications - No data to display   Initial Impression / Assessment and Plan / UC Course  I have reviewed the triage vital signs and the nursing notes.  Pertinent labs & imaging results that were available during my care of the patient were reviewed by me and considered in my medical decision making (see chart for details).   Chest xray is normal  Urine shows infection.   Pt advised pain  is probably muscular.  I will treat uti and try flexeril for uti.   I advised follow up with Dr. Denyse Amass if pain persist    Final Clinical Impressions(s) / UC Diagnoses   Final  diagnoses:  Urinary tract infection without hematuria, site unspecified    New Prescriptions Discharge Medication List as of 11/23/2016  7:16 PM    START taking these medications   Details  cephALEXin (KEFLEX) 500 MG capsule Take 1 capsule (500 mg total) by mouth 4 (four) times daily., Starting Fri 11/23/2016, Normal    methocarbamol (ROBAXIN) 500 MG tablet Take 1 tablet (500 mg total) by mouth 2 (two) times daily., Starting Fri 11/23/2016, Normal       An After Visit Summary was printed and given to the patient.   Controlled Substance Prescriptions [place] Controlled Substance Registry consulted? Not Applicable   Elson Areas, [place] 11/24/16 1116

## 2016-11-25 ENCOUNTER — Telehealth: Payer: Self-pay | Admitting: Emergency Medicine

## 2016-11-25 NOTE — Telephone Encounter (Signed)
Patient called back; gave her urine culture no growth info; she states she is improving with back pain.

## 2016-12-19 ENCOUNTER — Emergency Department (INDEPENDENT_AMBULATORY_CARE_PROVIDER_SITE_OTHER)
Admission: EM | Admit: 2016-12-19 | Discharge: 2016-12-19 | Disposition: A | Discharge status: Home / Self Care | Payer: 59 | Source: Home / Self Care | Attending: Family Medicine | Admitting: Family Medicine

## 2016-12-19 ENCOUNTER — Other Ambulatory Visit: Payer: Self-pay

## 2016-12-19 DIAGNOSIS — W57XXXA Bitten or stung by nonvenomous insect and other nonvenomous arthropods, initial encounter: Secondary | ICD-10-CM

## 2016-12-19 DIAGNOSIS — R21 Rash and other nonspecific skin eruption: Secondary | ICD-10-CM | POA: Diagnosis not present

## 2016-12-19 MED ORDER — TRIAMCINOLONE ACETONIDE 0.1 % EX CREA: 1.0000 "application " | TOPICAL_CREAM | Freq: Two times a day (BID) | CUTANEOUS | 0 refills | Status: DC

## 2016-12-19 MED ORDER — CETIRIZINE HCL 10 MG PO TABS: 10.0000 mg | ORAL_TABLET | Freq: Every day | ORAL | 0 refills | Status: DC

## 2016-12-19 MED ORDER — PREDNISONE 20 MG PO TABS: ORAL_TABLET | ORAL | 0 refills | Status: DC

## 2016-12-19 NOTE — ED Provider Notes (Signed)
[facility]    CSN: [id number] Arrival date & time: 12/19/16  1316     History   Chief Complaint Chief Complaint  Patient presents with  . Insect Bite    HPI [name] is a 31 y.o. female.   HPI [name] is a 31 y.o. female presenting to UC with c/o erythematous pruritic burning and sore insect bites to her Left upper arm, Left side of face, and Right arm.  She noticed the bites after sleeping over at a friend's house 2 days ago.  She states the friend did not have any bites or rashes. She has not tried anything for her symptoms because she was unsure what to try.    Past Medical History:  Diagnosis Date  . Infection    UTI  . Medical history non-contributory   . No significant past medical history     Patient Active Problem List   Diagnosis Date Noted  . Belly pain 02/28/2014  . Vaginal delivery 02/20/2014  . History of bilateral tubal ligation 02/20/2014  . Morbid obesity (HCC) 02/12/2014  . Positive GBS test 02/12/2014    Past Surgical History:  Procedure Laterality Date  . NO PAST SURGERIES      OB History    Gravida Para Term Preterm AB Living   2 2 2  0 0 2   SAB TAB Ectopic Multiple Live Births   0 0 0 0 2       Home Medications    Prior to Admission medications   Medication Sig Start Date End Date Taking? Authorizing Provider  cephALEXin (KEFLEX) 500 MG capsule Take 1 capsule (500 mg total) by mouth 4 (four) times daily. 11/23/16   Elson AreasSofia, Leslie K, PA-C  cetirizine (ZYRTEC) 10 MG tablet Take 1 tablet (10 mg total) daily by mouth. For 1-2 weeks, then daily as needed. 12/19/16   Lurene ShadowPhelps, Holmes Hays O, PA-C  cyclobenzaprine (FLEXERIL) 10 MG tablet Take 1 tablet (10 mg total) by mouth 2 (two) times daily as needed for muscle spasms. 11/23/16   Elson AreasSofia, Leslie K, PA-C  gentamicin (GARAMYCIN) 0.3 % ophthalmic solution Place 1 drop into the left eye every 4 (four) hours. For 7 days Patient not taking: Reported on 06/29/2016 06/21/16   Lurene ShadowPhelps,  Semiyah Newgent O, PA-C  predniSONE (DELTASONE) 20 MG tablet 3 tabs po day one, then 2 po daily x 4 days 12/19/16   Lurene ShadowPhelps, Candee Hoon O, PA-C  triamcinolone cream (KENALOG) 0.1 % Apply 1 application 2 (two) times daily topically. 12/19/16   Lurene ShadowPhelps, Jerrine Urschel O, PA-C    Family History Family History  Problem Relation Age of Onset  . Cancer Paternal Grandmother   . Hypertension Mother   . Hypertension Father     Social History Social History   Tobacco Use  . Smoking status: Former Smoker    Packs/day: 0.50    Types: Cigarettes    Last attempt to quit: 12/06/2015    Years since quitting: 1.0  . Smokeless tobacco: Never Used  . Tobacco comment: Feb 2015  Substance Use Topics  . Alcohol use: No    Comment: Occassional Use  . Drug use: No     Allergies   Patient has no known allergies.   Review of Systems Review of Systems  Constitutional: Negative for chills and fever.  Musculoskeletal: Negative for arthralgias, joint swelling and myalgias.  Skin: Positive for color change and rash. Negative for wound.  Neurological: Negative for weakness.     Physical  Exam Triage Vital Signs ED Triage Vitals  Enc Vitals Group     BP 12/19/16 1332 128/83     Pulse Rate 12/19/16 1332 78     Resp --      Temp 12/19/16 1332 98.4 F (36.9 C)     Temp Source 12/19/16 1332 Oral     SpO2 12/19/16 1332 99 %     Weight 12/19/16 1333 238 lb (108 kg)     Height 12/19/16 1333 5\' 6"  (1.676 m)     Head Circumference --      Peak Flow --      Pain Score 12/19/16 1333 3     Pain Loc --      Pain Edu? --      Excl. in GC? --    No data found.  Updated Vital Signs BP 128/83 (BP Location: Right Arm)   Pulse 78   Temp 98.4 F (36.9 C) (Oral)   Ht 5\' 6"  (1.676 m)   Wt 238 lb (108 kg)   LMP 12/08/2016   SpO2 99%   BMI 38.41 kg/m      Physical Exam  Constitutional: She is oriented to person, place, and time. She appears well-developed and well-nourished. No distress.  HENT:  Head: Normocephalic and  atraumatic.  Eyes: EOM are normal.  Neck: Normal range of motion.  Cardiovascular: Normal rate.  Pulses:      Radial pulses are 2+ on the right side, and 2+ on the left side.  Pulmonary/Chest: Effort normal.  Musculoskeletal: Normal range of motion. She exhibits edema and tenderness.  Left upper arm: mild edema, tenderness over deltoid/skin full ROM shoulder and elbow.  Neurological: She is alert and oriented to person, place, and time.  Skin: Skin is warm and dry. Rash noted. She is not diaphoretic. There is erythema.     Erythematous 8x10cm macular rash to Left upper arm, mild edema. Tender. Blanches. Similar smaller 2x3cm rash on Right forearm and two 1cm rashes on Left side of face  Psychiatric: She has a normal mood and affect. Her behavior is normal.  Nursing note and vitals reviewed.    UC Treatments / Results  Labs (all labs ordered are listed, but only abnormal results are displayed) Labs Reviewed - No data to display  EKG  EKG Interpretation None       Radiology No results found.  Procedures Procedures (including critical care time)  Medications Ordered in UC Medications - No data to display   Initial Impression / Assessment and Plan / UC Course  I have reviewed the triage vital signs and the nursing notes.  Pertinent labs & imaging results that were available during my care of the patient were reviewed by me and considered in my medical decision making (see chart for details).     Rash c/w insect bites w/o underlying infection Will treat with medications listed below Home care instructions provided F/u with PCP in 1 week if not improving.   Final Clinical Impressions(s) / UC Diagnoses   Final diagnoses:  Insect bite, initial encounter    ED Discharge Orders        Ordered    predniSONE (DELTASONE) 20 MG tablet     12/19/16 1342    cetirizine (ZYRTEC) 10 MG tablet  Daily     12/19/16 1342    triamcinolone cream (KENALOG) 0.1 %  2 times daily      12/19/16 1342       Controlled Substance Prescriptions   Controlled Substance Registry consulted? Not Applicable   Rolla Plate 12/19/16 1544

## 2016-12-19 NOTE — ED Triage Notes (Signed)
Pt stated that she stayed at a friends house Monday night.  Has a bite on upper left arm, lower right arm, left neck and cheek.  Red swollen, warm to the touch.

## 2016-12-21 ENCOUNTER — Telehealth: Payer: Self-pay

## 2016-12-21 NOTE — Telephone Encounter (Signed)
Left VM to call office if any questions or problems.  Contact information left.

## 2017-12-04 ENCOUNTER — Other Ambulatory Visit: Payer: Self-pay

## 2017-12-04 ENCOUNTER — Encounter (HOSPITAL_COMMUNITY): Payer: Self-pay | Admitting: Emergency Medicine

## 2017-12-04 ENCOUNTER — Emergency Department (HOSPITAL_COMMUNITY)
Admission: EM | Admit: 2017-12-04 | Discharge: 2017-12-05 | Disposition: A | Discharge status: Home / Self Care | Payer: 59 | Attending: Emergency Medicine | Admitting: Emergency Medicine

## 2017-12-04 DIAGNOSIS — Z87891 Personal history of nicotine dependence: Secondary | ICD-10-CM | POA: Insufficient documentation

## 2017-12-04 DIAGNOSIS — Z79899 Other long term (current) drug therapy: Secondary | ICD-10-CM | POA: Insufficient documentation

## 2017-12-04 DIAGNOSIS — H65112 Acute and subacute allergic otitis media (mucoid) (sanguinous) (serous), left ear: Secondary | ICD-10-CM | POA: Insufficient documentation

## 2017-12-04 NOTE — ED Triage Notes (Signed)
Patient reports left ear ache with pressure onset today , denies injury or hearing loss.

## 2017-12-05 MED ORDER — CETIRIZINE HCL 10 MG PO TABS: 10.0000 mg | ORAL_TABLET | Freq: Every day | ORAL | 0 refills | Status: DC

## 2017-12-05 MED ORDER — IBUPROFEN 800 MG PO TABS
800.0000 mg | ORAL_TABLET | Freq: Once | ORAL | Status: AC
  Administered 2017-12-05: 800 mg via ORAL
  Filled 2017-12-05: qty 1

## 2017-12-05 MED ORDER — AMOXICILLIN 500 MG PO CAPS
500.0000 mg | ORAL_CAPSULE | Freq: Once | ORAL | Status: AC
  Administered 2017-12-05: 500 mg via ORAL
  Filled 2017-12-05: qty 1

## 2017-12-05 MED ORDER — AMOXICILLIN 500 MG PO CAPS: 500.0000 mg | ORAL_CAPSULE | Freq: Three times a day (TID) | ORAL | 0 refills | Status: DC

## 2017-12-05 NOTE — ED Provider Notes (Signed)
[facility] Provider Note   CSN: [id number] Arrival date & time: 12/04/17  2305     History   Chief Complaint Chief Complaint  Patient presents with  . Otalgia    HPI [facility] is a 32 y.o. female.  Patient presents to the emergency department with a chief complaint of left-sided earache.  She reports worsening pain today.  Reports having had URI symptoms for the past week.  Reports having subjective fevers, but denies any chills.  She reports associated ear fullness.  Denies any other associated symptoms.  She has not tried taking anything for her ear pain.  The history is provided by the patient. No language interpreter was used.    Past Medical History:  Diagnosis Date  . Infection    UTI  . Medical history non-contributory   . No significant past medical history     Patient Active Problem List   Diagnosis Date Noted  . Belly pain 02/28/2014  . Vaginal delivery 02/20/2014  . History of bilateral tubal ligation 02/20/2014  . Morbid obesity (HCC) 02/12/2014  . Positive GBS test 02/12/2014    Past Surgical History:  Procedure Laterality Date  . NO PAST SURGERIES    . TUBAL LIGATION Bilateral 02/19/2014   Procedure: POST PARTUM Bilateral Salpingectomies;  Surgeon: Konrad Felix, MD;  Location: [facility];  Service: Gynecology;  Laterality: Bilateral;     OB History    Gravida  2   Para  2   Term  2   Preterm  0   AB  0   Living  2     SAB  0   TAB  0   Ectopic  0   Multiple  0   Live Births  2            Home Medications    Prior to Admission medications   Medication Sig Start Date End Date Taking? Authorizing Provider  cephALEXin (KEFLEX) 500 MG capsule Take 1 capsule (500 mg total) by mouth 4 (four) times daily. 11/23/16   Elson Areas, PA-C  cetirizine (ZYRTEC) 10 MG tablet Take 1 tablet (10 mg total) daily by mouth. For 1-2 weeks, then daily as needed. 12/19/16   Lurene Shadow, PA-C    cyclobenzaprine (FLEXERIL) 10 MG tablet Take 1 tablet (10 mg total) by mouth 2 (two) times daily as needed for muscle spasms. 11/23/16   Elson Areas, PA-C  gentamicin (GARAMYCIN) 0.3 % ophthalmic solution Place 1 drop into the left eye every 4 (four) hours. For 7 days Patient not taking: Reported on 06/29/2016 06/21/16   Lurene Shadow, PA-C  predniSONE (DELTASONE) 20 MG tablet 3 tabs po day one, then 2 po daily x 4 days 12/19/16   Lurene Shadow, PA-C  triamcinolone cream (KENALOG) 0.1 % Apply 1 application 2 (two) times daily topically. 12/19/16   Lurene Shadow, PA-C    Family History Family History  Problem Relation Age of Onset  . Cancer Paternal Grandmother   . Hypertension Mother   . Hypertension Father     Social History Social History   Tobacco Use  . Smoking status: Former Smoker    Packs/day: 0.50    Types: Cigarettes    Last attempt to quit: 12/06/2015    Years since quitting: 2.0  . Smokeless tobacco: Never Used  . Tobacco comment: Feb 2015  Substance Use Topics  . Alcohol use: No    Comment:  Occassional Use  . Drug use: No     Allergies   Patient has no known allergies.   Review of Systems Review of Systems  All other systems reviewed and are negative.    Physical Exam Updated Vital Signs BP 129/79 (BP Location: Right Arm)   Pulse 99   Temp 99 F (37.2 C) (Oral)   Resp 18   Ht 5\' 6"  (1.676 m)   Wt 108.9 kg   LMP 11/25/2017   SpO2 100%   BMI 38.74 kg/m   Physical Exam  Constitutional: She is oriented to person, place, and time. She appears well-developed and well-nourished.  HENT:  Head: Normocephalic and atraumatic.  Bilateral tympanic membranes are erythematous with mucus effusions  Eyes: Pupils are equal, round, and reactive to light. Conjunctivae and EOM are normal.  Neck: Normal range of motion. Neck supple.  Cardiovascular: Normal rate and regular rhythm. Exam reveals no gallop and no friction rub.  No murmur  heard. Pulmonary/Chest: Effort normal and breath sounds normal. No respiratory distress. She has no wheezes. She has no rales. She exhibits no tenderness.  CTAB  Abdominal: Soft. Bowel sounds are normal. She exhibits no distension and no mass. There is no tenderness. There is no rebound and no guarding.  Musculoskeletal: Normal range of motion. She exhibits no edema or tenderness.  Neurological: She is alert and oriented to person, place, and time.  Skin: Skin is warm and dry.  Psychiatric: She has a normal mood and affect. Her behavior is normal. Judgment and thought content normal.  Nursing note and vitals reviewed.    ED Treatments / Results  Labs (all labs ordered are listed, but only abnormal results are displayed) Labs Reviewed - No data to display  EKG None  Radiology No results found.  Procedures Procedures (including critical care time)  Medications Ordered in ED Medications  amoxicillin (AMOXIL) capsule 500 mg (has no administration in time range)  ibuprofen (ADVIL,MOTRIN) tablet 800 mg (has no administration in time range)     Initial Impression / Assessment and Plan / ED Course  I have reviewed the triage vital signs and the nursing notes.  Pertinent labs & imaging results that were available during my care of the patient were reviewed by me and considered in my medical decision making (see chart for details).     Patient with otitis media.  URI x 1 week.  Worsening ear pain.  Will treat with amox.  Final Clinical Impressions(s) / ED Diagnoses   Final diagnoses:  Acute mucoid otitis media of left ear    ED Discharge Orders         Ordered    amoxicillin (AMOXIL) 500 MG capsule  3 times daily     12/05/17 0046    cetirizine (ZYRTEC) 10 MG tablet  Daily     12/05/17 0046           Roxy Horseman, PA-C 12/05/17 0048    Gilda Crease, MD 12/05/17 [phone]

## 2018-04-02 ENCOUNTER — Other Ambulatory Visit: Payer: Self-pay

## 2018-04-02 ENCOUNTER — Ambulatory Visit (HOSPITAL_COMMUNITY)
Admission: EM | Admit: 2018-04-02 | Discharge: 2018-04-02 | Disposition: A | Discharge status: Home / Self Care | Payer: Self-pay | Attending: Family Medicine | Admitting: Family Medicine

## 2018-04-02 ENCOUNTER — Encounter (HOSPITAL_COMMUNITY): Payer: Self-pay

## 2018-04-02 DIAGNOSIS — N898 Other specified noninflammatory disorders of vagina: Secondary | ICD-10-CM | POA: Insufficient documentation

## 2018-04-02 MED ORDER — METRONIDAZOLE 500 MG PO TABS: 500.0000 mg | ORAL_TABLET | Freq: Two times a day (BID) | ORAL | 0 refills | Status: DC

## 2018-04-02 MED ORDER — FLUCONAZOLE 150 MG PO TABS: 150.0000 mg | ORAL_TABLET | Freq: Every day | ORAL | 0 refills | Status: DC

## 2018-04-02 NOTE — Discharge Instructions (Signed)
You were treated empirically for BV and yeast. Start diflucan and flagyl as directed. Cytology sent, you will be contacted with any positive results that requires further treatment. Refrain from sexual activity and alcohol use for the next 7 days. Monitor for any worsening of symptoms, fever, abdominal pain, nausea, vomiting, to follow up for reevaluation. ° °

## 2018-04-02 NOTE — ED Triage Notes (Signed)
Pt cc she thinks she has a yeast infection. Pt is not sure she has vaginal discharge x 4 days

## 2018-04-04 LAB — CERVICOVAGINAL ANCILLARY ONLY
CHLAMYDIA, DNA PROBE: NEGATIVE
NEISSERIA GONORRHEA: NEGATIVE
TRICH (WINDOWPATH): NEGATIVE

## 2018-04-04 NOTE — ED Provider Notes (Signed)
[facility]    CSN: [id number] Arrival date & time: 04/02/18  1835     History   Chief Complaint Chief Complaint  Patient presents with  . Vaginitis    HPI [facility] is a 33 y.o. female.   33 year old female comes in for 4 day history of vaginal discharge, itching. Denies fever, chills, night sweats. Denies urinary symptoms such as frequency, dysuria, hematuria. Denies abdominal pain, nausea, vomiting. Sexually active with one female partner, no condom use. LMP 03/16/2018     Past Medical History:  Diagnosis Date  . Infection    UTI  . Medical history non-contributory   . No significant past medical history     Patient Active Problem List   Diagnosis Date Noted  . Belly pain 02/28/2014  . Vaginal delivery 02/20/2014  . History of bilateral tubal ligation 02/20/2014  . Morbid obesity (HCC) 02/12/2014  . Positive GBS test 02/12/2014    Past Surgical History:  Procedure Laterality Date  . NO PAST SURGERIES    . TUBAL LIGATION Bilateral 02/19/2014   Procedure: POST PARTUM Bilateral Salpingectomies;  Surgeon: Konrad Felix, MD;  Location: [facility];  Service: Gynecology;  Laterality: Bilateral;    OB History    Gravida  2   Para  2   Term  2   Preterm  0   AB  0   Living  2     SAB  0   TAB  0   Ectopic  0   Multiple  0   Live Births  2            Home Medications    Prior to Admission medications   Medication Sig Start Date End Date Taking? Authorizing Provider  cetirizine (ZYRTEC) 10 MG tablet Take 1 tablet (10 mg total) by mouth daily. For 1-2 weeks, then daily as needed. 12/05/17   Roxy Horseman, PA-C  fluconazole (DIFLUCAN) 150 MG tablet Take 1 tablet (150 mg total) by mouth daily. Take second dose 72 hours later if symptoms still persists. 04/02/18   Cathie Hoops, Amy V, PA-C  metroNIDAZOLE (FLAGYL) 500 MG tablet Take 1 tablet (500 mg total) by mouth 2 (two) times daily. 04/02/18   Belinda Fisher, PA-C    Family History Family  History  Problem Relation Age of Onset  . Cancer Paternal Grandmother   . Hypertension Mother   . Hypertension Father     Social History Social History   Tobacco Use  . Smoking status: Former Smoker    Packs/day: 0.50    Types: Cigarettes    Last attempt to quit: 12/06/2015    Years since quitting: 2.3  . Smokeless tobacco: Never Used  . Tobacco comment: Feb 2015  Substance Use Topics  . Alcohol use: No    Comment: Occassional Use  . Drug use: No     Allergies   Patient has no known allergies.   Review of Systems Review of Systems  Reason unable to perform ROS: See HPI as above.     Physical Exam Triage Vital Signs ED Triage Vitals  Enc Vitals Group     BP 04/02/18 1928 140/69     Pulse Rate 04/02/18 1928 77     Resp 04/02/18 1928 18     Temp 04/02/18 1928 98.6 F (37 C)     Temp src --      SpO2 04/02/18 1928 100 %     Weight 04/02/18 1929 235  lb (106.6 kg)     Height --      Head Circumference --      Peak Flow --      Pain Score 04/02/18 1928 2     Pain Loc --      Pain Edu? --      Excl. in GC? --    No data found.  Updated Vital Signs BP 140/69 (BP Location: Right Arm)   Pulse 77   Temp 98.6 F (37 C)   Resp 18   Wt 235 lb (106.6 kg)   LMP 03/16/2018   SpO2 100%   BMI 37.93 kg/m   Visual Acuity Right Eye Distance:   Left Eye Distance:   Bilateral Distance:    Right Eye Near:   Left Eye Near:    Bilateral Near:     Physical Exam Constitutional:      General: She is not in acute distress.    Appearance: She is well-developed.  HENT:     Head: Normocephalic and atraumatic.  Eyes:     Conjunctiva/sclera: Conjunctivae normal.     Pupils: Pupils are equal, round, and reactive to light.  Cardiovascular:     Rate and Rhythm: Normal rate and regular rhythm.     Heart sounds: Normal heart sounds. No murmur. No friction rub. No gallop.   Pulmonary:     Effort: Pulmonary effort is normal.     Breath sounds: Normal breath sounds. No  wheezing or rales.  Abdominal:     General: Bowel sounds are normal.     Palpations: Abdomen is soft. There is no mass.     Tenderness: There is no abdominal tenderness. There is no guarding or rebound.  Skin:    General: Skin is warm and dry.  Neurological:     Mental Status: She is alert and oriented to person, place, and time.  Psychiatric:        Behavior: Behavior normal.        Judgment: Judgment normal.      UC Treatments / Results  Labs (all labs ordered are listed, but only abnormal results are displayed) Labs Reviewed  CERVICOVAGINAL ANCILLARY ONLY    EKG None  Radiology No results found.  Procedures Procedures (including critical care time)  Medications Ordered in UC Medications - No data to display  Initial Impression / Assessment and Plan / UC Course  I have reviewed the triage vital signs and the nursing notes.  Pertinent labs & imaging results that were available during my care of the patient were reviewed by me and considered in my medical decision making (see chart for details).    Patient was treated empirically for BV and yeast. Start diflucan and flagyl as directed. Cytology sent, patient will be contacted with any positive results that require additional treatment. Patient to refrain from sexual activity for the next 7 days. Return precautions given.   Final Clinical Impressions(s) / UC Diagnoses   Final diagnoses:  Vaginal discharge    ED Prescriptions    Medication Sig Dispense Auth. Provider   metroNIDAZOLE (FLAGYL) 500 MG tablet Take 1 tablet (500 mg total) by mouth 2 (two) times daily. 14 tablet Yu, Amy V, PA-C   fluconazole (DIFLUCAN) 150 MG tablet Take 1 tablet (150 mg total) by mouth daily. Take second dose 72 hours later if symptoms still persists. 2 tablet Threasa Alpha, PA-C 04/04/18 1718

## 2018-04-16 ENCOUNTER — Emergency Department (HOSPITAL_COMMUNITY)
Admission: EM | Admit: 2018-04-16 | Discharge: 2018-04-16 | Disposition: A | Discharge status: Home / Self Care | Payer: 59 | Attending: Emergency Medicine | Admitting: Emergency Medicine

## 2018-04-16 ENCOUNTER — Emergency Department (INDEPENDENT_AMBULATORY_CARE_PROVIDER_SITE_OTHER)
Admission: EM | Admit: 2018-04-16 | Discharge: 2018-04-16 | Disposition: A | Discharge status: Home / Self Care | Payer: 59 | Source: Home / Self Care

## 2018-04-16 ENCOUNTER — Emergency Department (HOSPITAL_COMMUNITY): Payer: 59

## 2018-04-16 ENCOUNTER — Encounter: Payer: Self-pay | Admitting: *Deleted

## 2018-04-16 ENCOUNTER — Other Ambulatory Visit: Payer: Self-pay

## 2018-04-16 ENCOUNTER — Encounter (HOSPITAL_COMMUNITY): Payer: Self-pay | Admitting: Emergency Medicine

## 2018-04-16 DIAGNOSIS — Z87891 Personal history of nicotine dependence: Secondary | ICD-10-CM | POA: Diagnosis not present

## 2018-04-16 DIAGNOSIS — R6889 Other general symptoms and signs: Secondary | ICD-10-CM

## 2018-04-16 DIAGNOSIS — J069 Acute upper respiratory infection, unspecified: Secondary | ICD-10-CM | POA: Insufficient documentation

## 2018-04-16 DIAGNOSIS — J029 Acute pharyngitis, unspecified: Secondary | ICD-10-CM | POA: Diagnosis present

## 2018-04-16 MED ORDER — GUAIFENESIN ER 600 MG PO TB12: 600.0000 mg | ORAL_TABLET | Freq: Two times a day (BID) | ORAL | 0 refills | Status: DC

## 2018-04-16 MED ORDER — OSELTAMIVIR PHOSPHATE 75 MG PO CAPS: 75.0000 mg | ORAL_CAPSULE | Freq: Two times a day (BID) | ORAL | 0 refills | Status: DC

## 2018-04-16 MED ORDER — BENZONATATE 100 MG PO CAPS: 100.0000 mg | ORAL_CAPSULE | Freq: Three times a day (TID) | ORAL | 0 refills | Status: DC

## 2018-04-16 NOTE — ED Triage Notes (Signed)
C/O of headache, cough, sore throat, and generalized malaise. Pt recently in peds ED with her son and thinks she might have gotten sick there.

## 2018-04-16 NOTE — ED Provider Notes (Signed)
[facility] EMERGENCY DEPARTMENT Provider Note   CSN: [id number] Arrival date & time: 04/16/18  0551    History   Chief Complaint Chief Complaint  Patient presents with  . Flu Symptoms    HPI GARYN BOLLA is a 33 y.o. female.     HPI   Pt is a 33 y/o female who presents to the ED today for evaluation of sore throat, cough, body aches, fevers, nasal congestion, rhinorrhea, HA that began about 4-5 days ago. She states she spent the night in the children's unit of the hospital this week with her son was sick. States she has taken antihistamine, dayquil, and oscillococinum. She also reports diarrhea for the last several days. She has had decreased appetite and therefor has not eaten much in the last few days. She denies any sob. She feels like her symptoms have been improving. She denies any recent foreign travel or contact with those who have recently traveled to a foreign country.  Past Medical History:  Diagnosis Date  . Infection    UTI  . Medical history non-contributory   . No significant past medical history     Patient Active Problem List   Diagnosis Date Noted  . Belly pain 02/28/2014  . Vaginal delivery 02/20/2014  . History of bilateral tubal ligation 02/20/2014  . Morbid obesity (HCC) 02/12/2014  . Positive GBS test 02/12/2014    Past Surgical History:  Procedure Laterality Date  . NO PAST SURGERIES    . TUBAL LIGATION Bilateral 02/19/2014   Procedure: POST PARTUM Bilateral Salpingectomies;  Surgeon: Konrad Felix, MD;  Location: [facility];  Service: Gynecology;  Laterality: Bilateral;     OB History    Gravida  2   Para  2   Term  2   Preterm  0   AB  0   Living  2     SAB  0   TAB  0   Ectopic  0   Multiple  0   Live Births  2            Home Medications    Prior to Admission medications   Medication Sig Start Date End Date Taking? Authorizing Provider  benzonatate (TESSALON) 100 MG capsule Take 1 capsule  (100 mg total) by mouth every 8 (eight) hours for 5 days. 04/16/18 04/21/18  Sly Parlee S, PA-C  cetirizine (ZYRTEC) 10 MG tablet Take 1 tablet (10 mg total) by mouth daily. For 1-2 weeks, then daily as needed. 12/05/17   Roxy Horseman, PA-C  fluconazole (DIFLUCAN) 150 MG tablet Take 1 tablet (150 mg total) by mouth daily. Take second dose 72 hours later if symptoms still persists. 04/02/18   Cathie Hoops, Amy V, PA-C  guaiFENesin (MUCINEX) 600 MG 12 hr tablet Take 1 tablet (600 mg total) by mouth 2 (two) times daily. 04/16/18   Lavel Rieman S, PA-C  metroNIDAZOLE (FLAGYL) 500 MG tablet Take 1 tablet (500 mg total) by mouth 2 (two) times daily. 04/02/18   Belinda Fisher, PA-C    Family History Family History  Problem Relation Age of Onset  . Cancer Paternal Grandmother   . Hypertension Mother   . Hypertension Father     Social History Social History   Tobacco Use  . Smoking status: Former Smoker    Packs/day: 0.50    Types: Cigarettes    Last attempt to quit: 12/06/2015    Years since quitting: 2.3  . Smokeless tobacco: Never Used  .  Tobacco comment: Feb 2015  Substance Use Topics  . Alcohol use: No    Comment: Occassional Use  . Drug use: No     Allergies   Patient has no known allergies.   Review of Systems Review of Systems  Constitutional: Positive for appetite change, chills, diaphoresis, fatigue and fever.  HENT: Positive for congestion, rhinorrhea and sore throat. Negative for ear pain.   Eyes: Negative for visual disturbance.  Respiratory: Positive for cough. Negative for shortness of breath.   Cardiovascular: Negative for chest pain.  Gastrointestinal: Positive for diarrhea. Negative for abdominal pain, constipation, nausea and vomiting.  Genitourinary: Negative for dysuria and hematuria.  Musculoskeletal: Negative for back pain.  Skin: Negative for rash.  Neurological: Positive for headaches.  All other systems reviewed and are negative.    Physical Exam Updated  Vital Signs BP 131/87 (BP Location: Right Arm)   Pulse 71   Temp 98.2 F (36.8 C) (Oral)   Ht 5\' 5"  (1.651 m)   Wt 113.4 kg   LMP 04/16/2018   SpO2 95%   BMI 41.60 kg/m   Physical Exam Vitals signs and nursing note reviewed.  Constitutional:      General: She is not in acute distress.    Appearance: She is well-developed. She is not ill-appearing or toxic-appearing.     Comments: Pt is well appearing  HENT:     Head: Normocephalic and atraumatic.     Right Ear: Tympanic membrane normal.     Left Ear: Tympanic membrane normal.     Nose: Nose normal.     Mouth/Throat:     Mouth: Mucous membranes are moist.     Pharynx: No oropharyngeal exudate or posterior oropharyngeal erythema.  Eyes:     Conjunctiva/sclera: Conjunctivae normal.     Pupils: Pupils are equal, round, and reactive to light.  Neck:     Musculoskeletal: Neck supple.  Cardiovascular:     Rate and Rhythm: Normal rate and regular rhythm.     Heart sounds: Normal heart sounds. No murmur.  Pulmonary:     Effort: Pulmonary effort is normal. No respiratory distress.     Breath sounds: Normal breath sounds. No stridor. No wheezing, rhonchi or rales.  Abdominal:     General: Bowel sounds are normal.     Palpations: Abdomen is soft.     Tenderness: There is no abdominal tenderness.  Skin:    General: Skin is warm and dry.  Neurological:     Mental Status: She is alert.  Psychiatric:        Mood and Affect: Mood normal.    ED Treatments / Results  Labs (all labs ordered are listed, but only abnormal results are displayed) Labs Reviewed - No data to display  EKG None  Radiology Dg Chest 2 View  Result Date: 04/16/2018 CLINICAL DATA:  URI EXAM: CHEST - 2 VIEW COMPARISON:  11/23/2016 FINDINGS: Normal heart size and mediastinal contours. No acute infiltrate or edema. No effusion or pneumothorax. No acute osseous findings. IMPRESSION: Negative chest. Electronically Signed   By: Marnee Spring M.D.   On:  04/16/2018 07:16    Procedures Procedures (including critical care time)  Medications Ordered in ED Medications - No data to display   Initial Impression / Assessment and Plan / ED Course  I have reviewed the triage vital signs and the nursing notes.  Pertinent labs & imaging results that were available during my care of the patient were reviewed by me and considered  in my medical decision making (see chart for details).   Final Clinical Impressions(s) / ED Diagnoses   Final diagnoses:  Viral URI   Pt CXR negative for acute infiltrate. Patients symptoms are consistent with URI, likely viral etiology. Discussed that antibiotics are not indicated for viral infections. Pt will be discharged with symptomatic treatment.  Verbalizes understanding and is agreeable with plan. Pt is hemodynamically stable & in NAD prior to dc.  ED Discharge Orders         Ordered    benzonatate (TESSALON) 100 MG capsule  Every 8 hours     04/16/18 0653    guaiFENesin (MUCINEX) 600 MG 12 hr tablet  2 times daily     04/16/18 0653           Karrie Meres, PA-C 04/16/18 0824    Melene Plan, DO 04/18/18 1502

## 2018-04-16 NOTE — ED Notes (Signed)
Pt discharged from ED; instructions provided and scripts given; Pt encouraged to return to ED if symptoms worsen and to f/u with PCP; Pt verbalized understanding of all instructions 

## 2018-04-16 NOTE — Discharge Instructions (Addendum)
Your chest xray did not show evidence of pneumonia. I think that you have a viral upper respiratory infection.  If you were given a prescription, please take the prescription as you were instructed and follow the directions given on the discharge paperwork.   Over the next several days you should rest as much as possible, and drink more fluids than usual. Liquids will help thin and loosen mucus so you can cough it up. Liquids will also help prevent dehydration. Using a cool mist humidifier or a vaporizer to increase air moisture in your home can also make it easier for you to breathe and help decrease your cough.  To help soothe a sore throat gargle with warm salt water.  Make salt water by dissolving  teaspoon salt in 1 cup warm water. You may also use throat lozenges and over the counter sore throat spray.  Please follow up with your primary care provider within 5-7 days for re-evaluation of your symptoms. If you do not have a primary care provider, information for a healthcare clinic has been provided for you to make arrangements for follow up care. Please return to the emergency department for any persistent fevers, worsening sore throat/hoarse voice, inability to swallow, persistent vomiting, chest pain, shortness of breath, coughing up blood, or any new or worsening symptoms.

## 2018-04-16 NOTE — ED Notes (Signed)
Patient transported to X-ray 

## 2018-04-16 NOTE — ED Provider Notes (Signed)
[name]    CSN: [id number] Arrival date & time: 04/16/18  1252     History   Chief Complaint Chief Complaint  Patient presents with  . Cough  . Fatigue    HPI Madison Davila is a 33 y.o. female.   Patient c/o 2-3 days of fatigue, low grade fever, productive cough. She was recently in the hospital with her son who was sick. Went to [facility] ED this AM, Diagnosed with viral URI, Rx for benzonatate and Mucinex. Mother + for flu today  Patient is an occasional smoker.  No h/o asthma.  Symptoms really began 2 days ago.     Past Medical History:  Diagnosis Date  . Infection    UTI  . Medical history non-contributory   . No significant past medical history     Patient Active Problem List   Diagnosis Date Noted  . Belly pain 02/28/2014  . Vaginal delivery 02/20/2014  . History of bilateral tubal ligation 02/20/2014  . Morbid obesity (HCC) 02/12/2014  . Positive GBS test 02/12/2014    Past Surgical History:  Procedure Laterality Date  . NO PAST SURGERIES    . TUBAL LIGATION Bilateral 02/19/2014   Procedure: POST PARTUM Bilateral Salpingectomies;  Surgeon: Konrad Felix, MD;  Location: [facility];  Service: Gynecology;  Laterality: Bilateral;    OB History    Gravida  2   Para  2   Term  2   Preterm  0   AB  0   Living  2     SAB  0   TAB  0   Ectopic  0   Multiple  0   Live Births  2            Home Medications    Prior to Admission medications   Medication Sig Start Date End Date Taking? Authorizing Provider  guaiFENesin (MUCINEX) 600 MG 12 hr tablet Take 1 tablet (600 mg total) by mouth 2 (two) times daily. 04/16/18   Couture, Cortni S, PA-C  oseltamivir (TAMIFLU) 75 MG capsule Take 1 capsule (75 mg total) by mouth every 12 (twelve) hours. 04/16/18   Elvina Sidle, MD    Family History Family History  Problem Relation Age of Onset  . Cancer Paternal Grandmother   . Hypertension Mother   . Hypertension Father      Social History Social History   Tobacco Use  . Smoking status: Former Smoker    Packs/day: 0.50    Types: Cigarettes    Last attempt to quit: 12/06/2015    Years since quitting: 2.3  . Smokeless tobacco: Never Used  . Tobacco comment: Feb 2015  Substance Use Topics  . Alcohol use: No    Comment: Occassional Use  . Drug use: No     Allergies   Patient has no known allergies.   Review of Systems Review of Systems   Physical Exam Triage Vital Signs ED Triage Vitals  Enc Vitals Group     BP 04/16/18 1346 (!) 132/92     Pulse Rate 04/16/18 1346 84     Resp 04/16/18 1346 16     Temp 04/16/18 1346 98.8 F (37.1 C)     Temp Source 04/16/18 1346 Oral     SpO2 04/16/18 1346 100 %     Weight 04/16/18 1347 222 lb (100.7 kg)     Height --      Head Circumference --      Peak  Flow --      Pain Score 04/16/18 1347 0     Pain Loc --      Pain Edu? --      Excl. in GC? --    No data found.  Updated Vital Signs BP (!) 132/92 (BP Location: Right Arm)   Pulse 84   Temp 98.8 F (37.1 C) (Oral)   Resp 16   Wt 100.7 kg   LMP 04/13/2018   SpO2 100%   BMI 36.94 kg/m    Physical Exam Vitals signs and nursing note reviewed.  Constitutional:      Appearance: Normal appearance.  HENT:     Head: Normocephalic.     Right Ear: Tympanic membrane and external ear normal.     Left Ear: Tympanic membrane and external ear normal.     Nose: Congestion present.     Mouth/Throat:     Pharynx: Oropharynx is clear.  Eyes:     Conjunctiva/sclera: Conjunctivae normal.  Neck:     Musculoskeletal: Normal range of motion and neck supple.  Cardiovascular:     Pulses: Normal pulses.  Pulmonary:     Effort: Pulmonary effort is normal.     Breath sounds: Wheezing and rales present.  Musculoskeletal: Normal range of motion.  Skin:    General: Skin is warm and dry.  Neurological:     General: No focal deficit present.     Mental Status: She is alert and oriented to person,  place, and time.  Psychiatric:        Mood and Affect: Mood normal.      UC Treatments / Results  Labs (all labs ordered are listed, but only abnormal results are displayed) Labs Reviewed - No data to display  EKG None  Radiology Dg Chest 2 View  Result Date: 04/16/2018 CLINICAL DATA:  URI EXAM: CHEST - 2 VIEW COMPARISON:  11/23/2016 FINDINGS: Normal heart size and mediastinal contours. No acute infiltrate or edema. No effusion or pneumothorax. No acute osseous findings. IMPRESSION: Negative chest. Electronically Signed   By: Marnee Spring M.D.   On: 04/16/2018 07:16    Procedures Procedures (including critical care time)  Medications Ordered in UC Medications - No data to display  Initial Impression / Assessment and Plan / UC Course  I have reviewed the triage vital signs and the nursing notes.  Pertinent labs & imaging results that were available during my care of the patient were reviewed by me and considered in my medical decision making (see chart for details).    Final Clinical Impressions(s) / UC Diagnoses   Final diagnoses:  Flu-like symptoms     Discharge Instructions     Use of the nebulizer 2-3 days will help open the bronchial tubes.  Please return in 2 days if not improving.    ED Prescriptions    Medication Sig Dispense Auth. Provider   oseltamivir (TAMIFLU) 75 MG capsule Take 1 capsule (75 mg total) by mouth every 12 (twelve) hours. 10 capsule Elvina Sidle, MD     Controlled Substance Prescriptions [place] Controlled Substance Registry consulted? Not Applicable   Elvina Sidle, MD 04/16/18 1413

## 2018-04-16 NOTE — ED Triage Notes (Addendum)
Patient c/o 2-3 days of fatigue, low grade fever, productive cough. She was recently in the hospital with her son who was sick. Went to [facility] ED this AM, Diagnosed with viral URI, Rx for benzonatate and Mucinex. Mother + for flu today

## 2018-04-16 NOTE — Discharge Instructions (Addendum)
Use of the nebulizer 2-3 days will help open the bronchial tubes.  Please return in 2 days if not improving.

## 2019-10-16 IMAGING — DX DG CHEST 2V
2 series · 2 of 2 positions shown · non-contrast
Comparison: 11/23/2016

CLINICAL DATA: URI

EXAM:
CHEST - 2 VIEW

[chest pa]
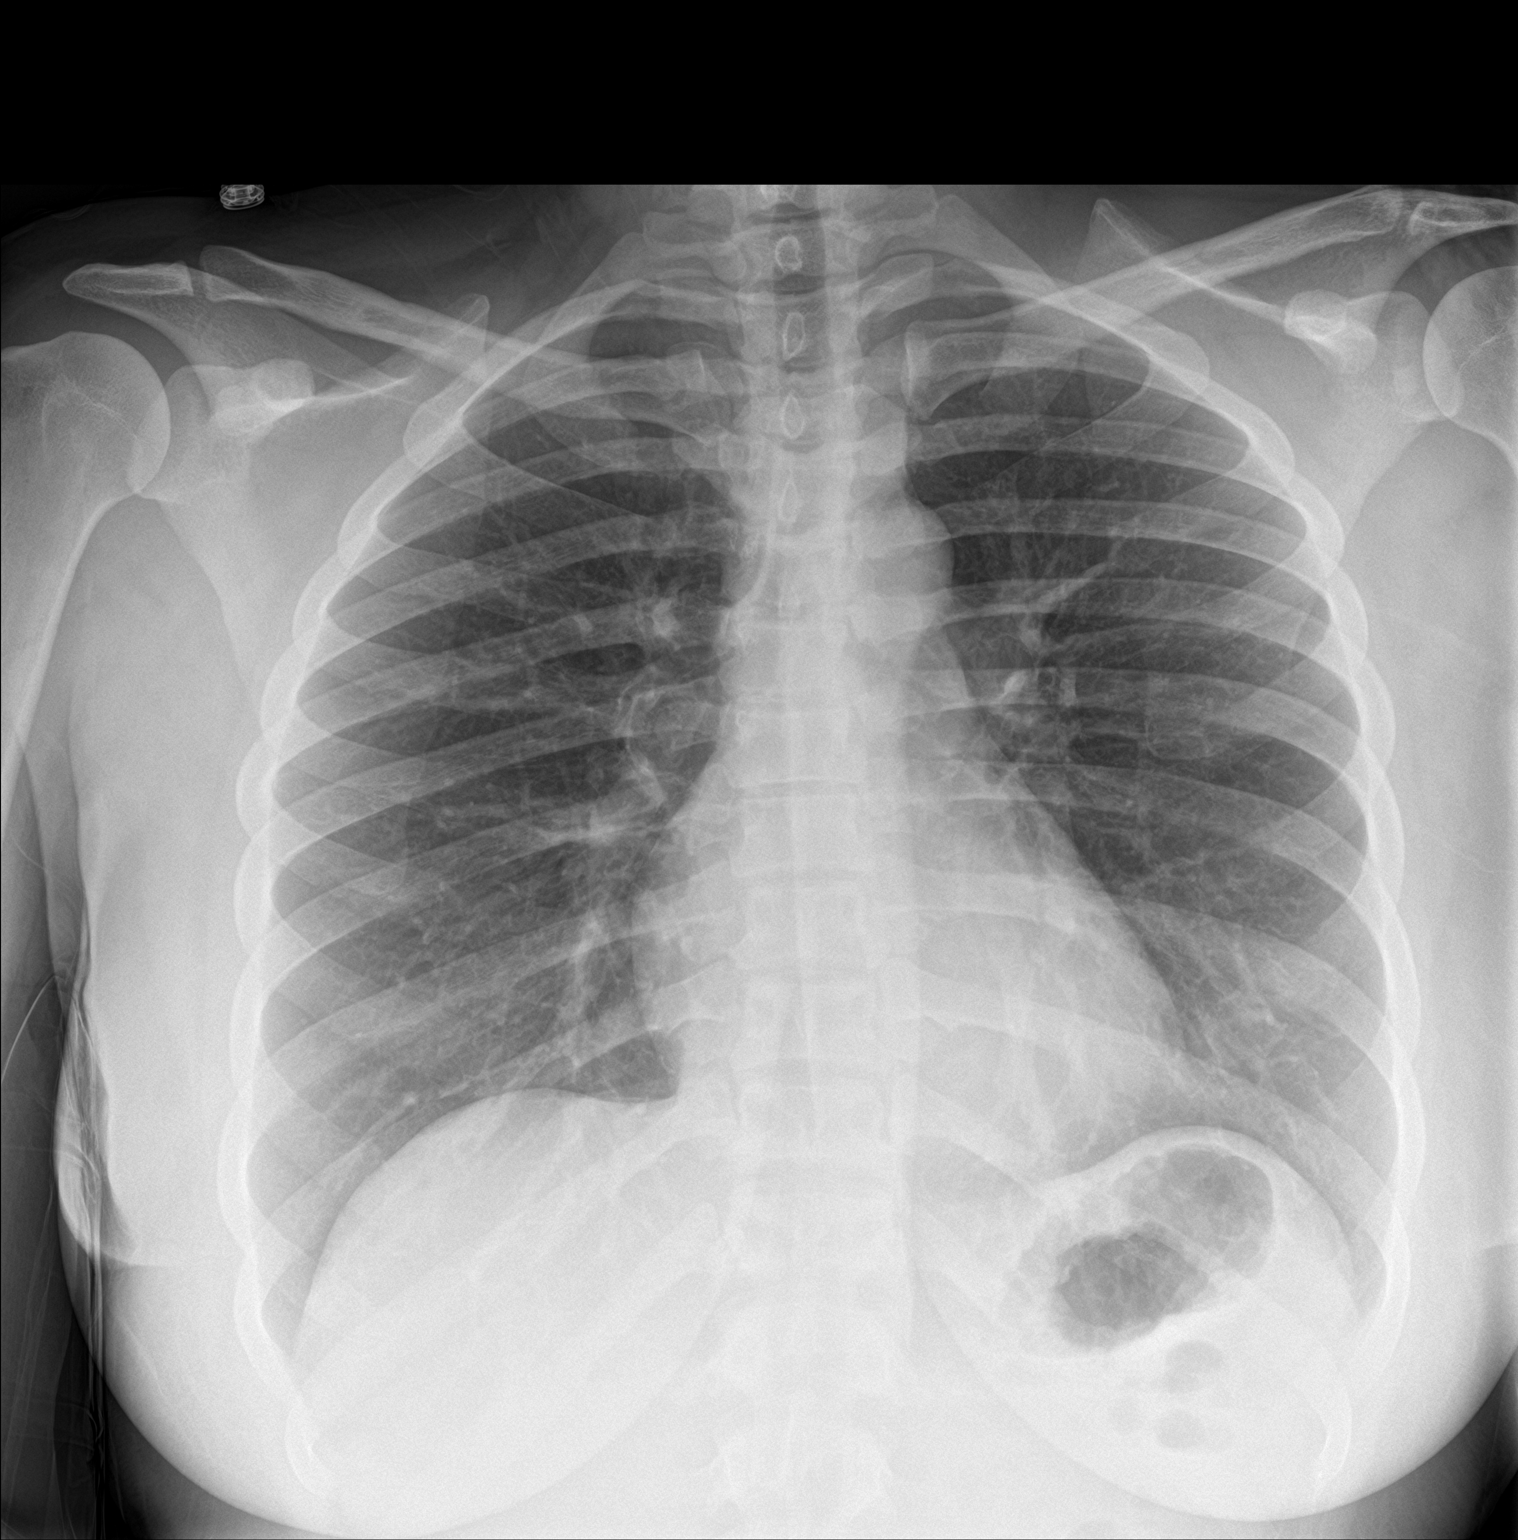

[chest lat]
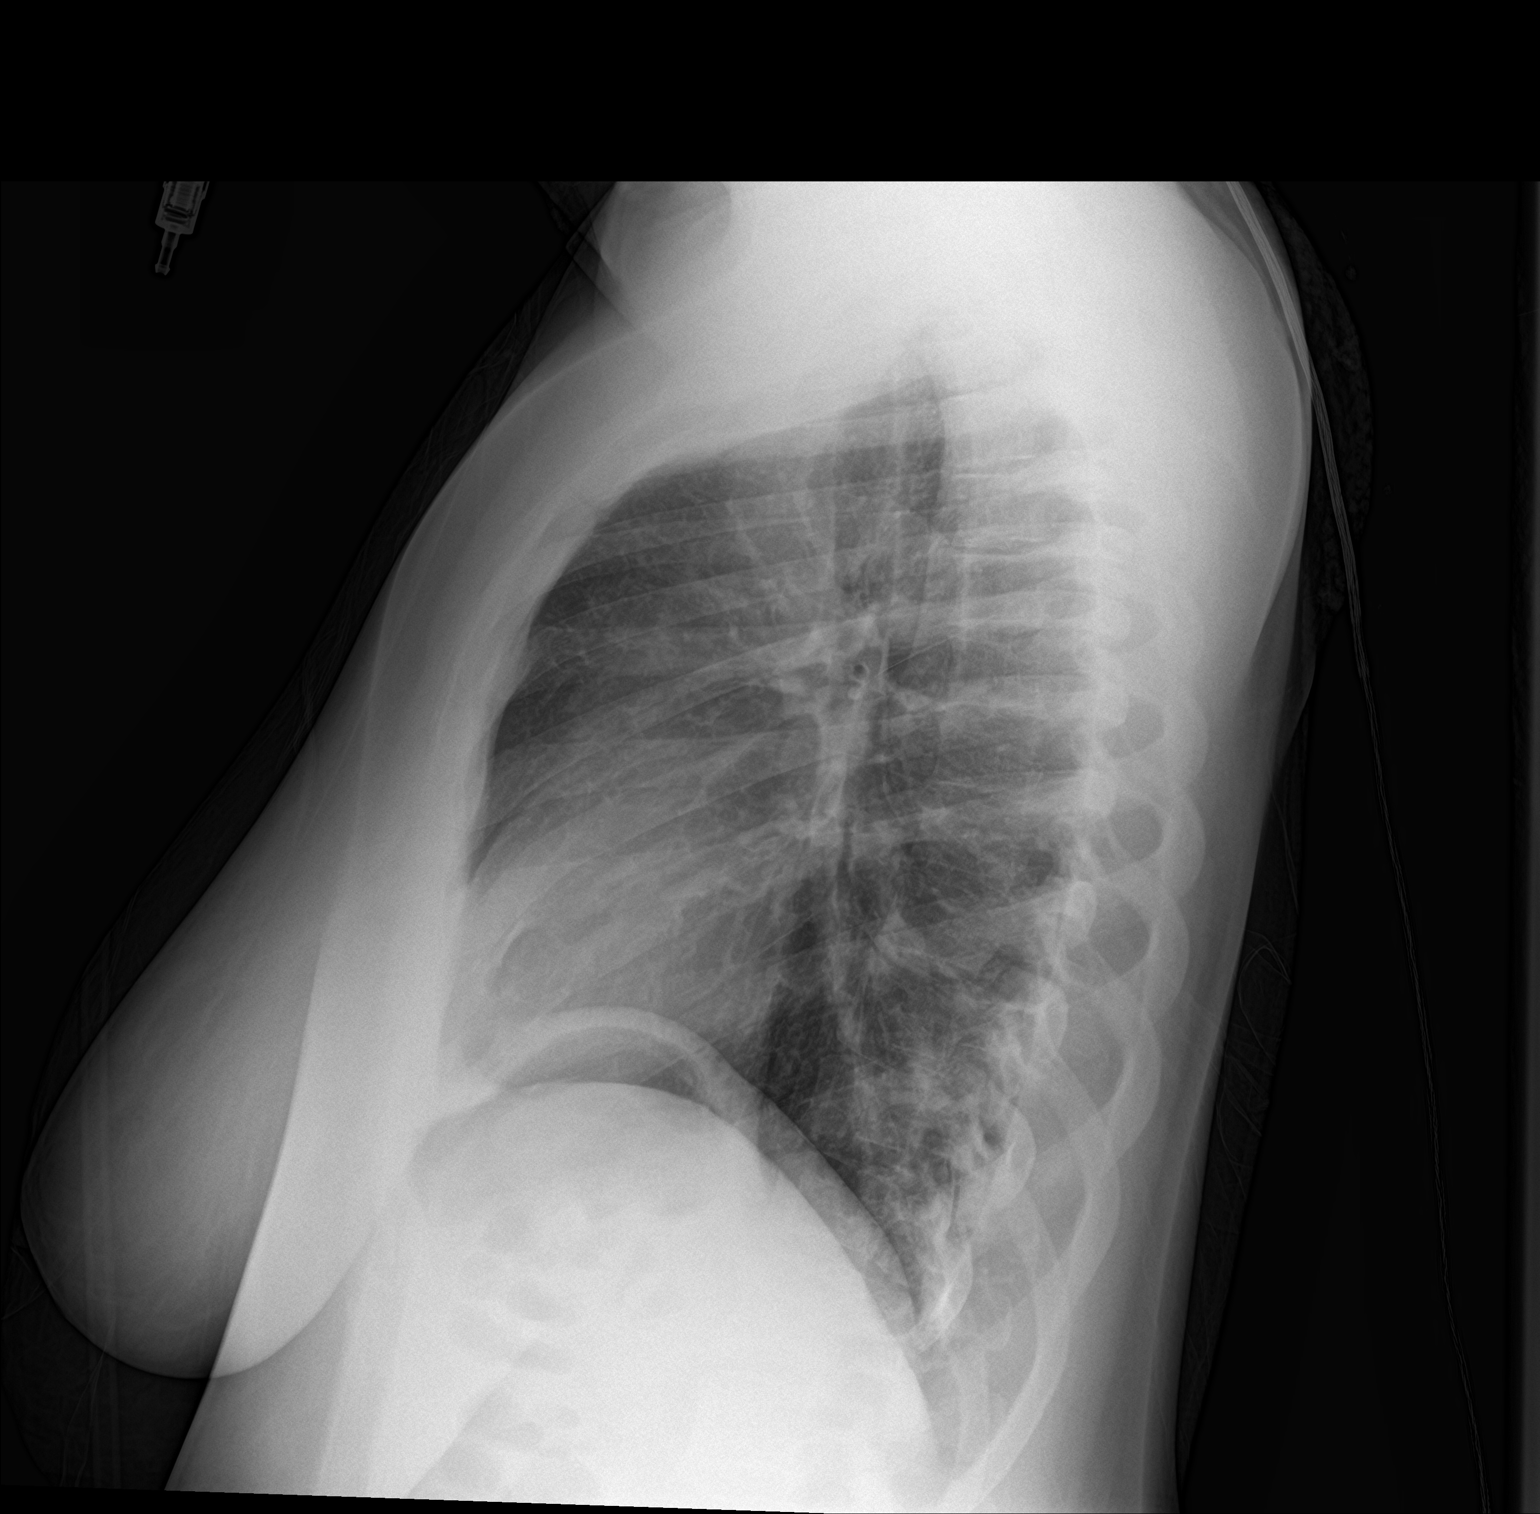

[2 of 2 positions shown; findings below may reference images not displayed]

FINDINGS: Normal heart size and mediastinal contours. No acute infiltrate or
edema. No effusion or pneumothorax. No acute osseous findings.
IMPRESSION: Negative chest.

## 2019-11-13 DIAGNOSIS — Z20822 Contact with and (suspected) exposure to covid-19: Secondary | ICD-10-CM | POA: Diagnosis not present

## 2019-11-25 DIAGNOSIS — N760 Acute vaginitis: Secondary | ICD-10-CM | POA: Diagnosis not present

## 2019-11-25 DIAGNOSIS — N898 Other specified noninflammatory disorders of vagina: Secondary | ICD-10-CM | POA: Diagnosis not present

## 2020-01-11 DIAGNOSIS — Z20822 Contact with and (suspected) exposure to covid-19: Secondary | ICD-10-CM | POA: Diagnosis not present

## 2020-02-07 DIAGNOSIS — J069 Acute upper respiratory infection, unspecified: Secondary | ICD-10-CM | POA: Diagnosis not present

## 2020-02-07 DIAGNOSIS — U071 COVID-19: Secondary | ICD-10-CM | POA: Diagnosis not present

## 2020-02-07 DIAGNOSIS — Z1152 Encounter for screening for COVID-19: Secondary | ICD-10-CM | POA: Diagnosis not present

## 2020-02-12 DIAGNOSIS — B349 Viral infection, unspecified: Secondary | ICD-10-CM | POA: Diagnosis not present

## 2020-02-12 DIAGNOSIS — Z20828 Contact with and (suspected) exposure to other viral communicable diseases: Secondary | ICD-10-CM | POA: Diagnosis not present

## 2020-07-16 ENCOUNTER — Encounter (HOSPITAL_BASED_OUTPATIENT_CLINIC_OR_DEPARTMENT_OTHER): Payer: Self-pay | Admitting: Emergency Medicine

## 2020-07-16 ENCOUNTER — Emergency Department (HOSPITAL_BASED_OUTPATIENT_CLINIC_OR_DEPARTMENT_OTHER)
Admission: EM | Admit: 2020-07-16 | Discharge: 2020-07-16 | Disposition: A | Discharge status: Home / Self Care | Payer: BC Managed Care – PPO | Attending: Emergency Medicine | Admitting: Emergency Medicine

## 2020-07-16 ENCOUNTER — Other Ambulatory Visit: Payer: Self-pay

## 2020-07-16 ENCOUNTER — Emergency Department (HOSPITAL_BASED_OUTPATIENT_CLINIC_OR_DEPARTMENT_OTHER): Payer: BC Managed Care – PPO

## 2020-07-16 DIAGNOSIS — Z03818 Encounter for observation for suspected exposure to other biological agents ruled out: Secondary | ICD-10-CM | POA: Diagnosis not present

## 2020-07-16 DIAGNOSIS — Z87891 Personal history of nicotine dependence: Secondary | ICD-10-CM | POA: Insufficient documentation

## 2020-07-16 DIAGNOSIS — R059 Cough, unspecified: Secondary | ICD-10-CM | POA: Diagnosis not present

## 2020-07-16 DIAGNOSIS — Z20822 Contact with and (suspected) exposure to covid-19: Secondary | ICD-10-CM | POA: Insufficient documentation

## 2020-07-16 DIAGNOSIS — B9789 Other viral agents as the cause of diseases classified elsewhere: Secondary | ICD-10-CM | POA: Diagnosis not present

## 2020-07-16 DIAGNOSIS — J069 Acute upper respiratory infection, unspecified: Secondary | ICD-10-CM | POA: Diagnosis not present

## 2020-07-16 LAB — SARS CORONAVIRUS 2 (TAT 6-24 HRS): SARS Coronavirus 2: NEGATIVE

## 2020-07-16 MED ORDER — ALBUTEROL SULFATE HFA 108 (90 BASE) MCG/ACT IN AERS
2.0000 | INHALATION_SPRAY | Freq: Once | RESPIRATORY_TRACT | Status: AC
  Administered 2020-07-16: 2 via RESPIRATORY_TRACT
  Filled 2020-07-16: qty 6.7

## 2020-07-16 NOTE — ED Provider Notes (Signed)
[facility]ENCY DEPT Provider Note   CSN: [id number] Arrival date & time: 07/16/20  1011     History Chief Complaint  Patient presents with  . Shortness of Breath    Madison Davila is a 35 y.o. female.  Presented to ER with concern for cough, congestion.  Patient reports over the past week she has started having various symptoms.  Initially noted slightly scratchy/painful throat.  This is since improved.  No difficulty swallowing.  Has had cough for the past week, relatively constant, though now she has noted some production.  Mostly clear.  No blood.  Felt slightly short of breath while taking a shower this morning but she denies any shortness of breath at present.  Has not had any episodes of chest pain.  No chest pain at present.  No chills or fevers.  Has had some sinus congestion and nasal drainage.  Unsure of any COVID contacts, did have a negative at home test on Wednesday.  HPI     Past Medical History:  Diagnosis Date  . Infection    UTI  . Medical history non-contributory   . No significant past medical history     Patient Active Problem List   Diagnosis Date Noted  . Belly pain 02/28/2014  . Vaginal delivery 02/20/2014  . History of bilateral tubal ligation 02/20/2014  . Morbid obesity (HCC) 02/12/2014  . Positive GBS test 02/12/2014    Past Surgical History:  Procedure Laterality Date  . NO PAST SURGERIES    . TUBAL LIGATION Bilateral 02/19/2014   Procedure: POST PARTUM Bilateral Salpingectomies;  Surgeon: Konrad Felix, MD;  Location: [facility];  Service: Gynecology;  Laterality: Bilateral;     OB History    Gravida  2   Para  2   Term  2   Preterm  0   AB  0   Living  2     SAB  0   IAB  0   Ectopic  0   Multiple  0   Live Births  2           Family History  Problem Relation Age of Onset  . Cancer Paternal Grandmother   . Hypertension Mother   . Hypertension Father     Social History   Tobacco Use  .  Smoking status: Former Smoker    Packs/day: 0.50    Types: Cigarettes    Quit date: 12/06/2015    Years since quitting: 4.6  . Smokeless tobacco: Never Used  . Tobacco comment: Feb 2015  Vaping Use  . Vaping Use: Never used  Substance Use Topics  . Alcohol use: No    Comment: Occassional Use  . Drug use: No    Home Medications Prior to Admission medications   Medication Sig Start Date End Date Taking? Authorizing Provider  guaiFENesin (MUCINEX) 600 MG 12 hr tablet Take 1 tablet (600 mg total) by mouth 2 (two) times daily. 04/16/18   Couture, Cortni S, PA-C  oseltamivir (TAMIFLU) 75 MG capsule Take 1 capsule (75 mg total) by mouth every 12 (twelve) hours. 04/16/18   Elvina Sidle, MD    Allergies    Patient has no known allergies.  Review of Systems   Review of Systems  Constitutional: Positive for chills and fatigue. Negative for fever.  HENT: Negative for ear pain and sore throat.   Eyes: Negative for pain and visual disturbance.  Respiratory: Positive for cough. Negative for shortness of breath.  Cardiovascular: Negative for chest pain and palpitations.  Gastrointestinal: Negative for abdominal pain and vomiting.  Genitourinary: Negative for dysuria and hematuria.  Musculoskeletal: Negative for arthralgias and back pain.  Skin: Negative for color change and rash.  Neurological: Negative for seizures and syncope.  All other systems reviewed and are negative.   Physical Exam Updated Vital Signs BP 123/80   Pulse (!) 58   Temp 99 F (37.2 C) (Oral)   Resp 18   Ht 5\' 5"  (1.651 m)   Wt 111.1 kg   LMP 07/12/2020   SpO2 100%   BMI 40.77 kg/m   Physical Exam Vitals and nursing note reviewed.  Constitutional:      General: She is not in acute distress.    Appearance: She is well-developed.  HENT:     Head: Normocephalic and atraumatic.  Eyes:     Conjunctiva/sclera: Conjunctivae normal.  Cardiovascular:     Rate and Rhythm: Normal rate and regular rhythm.      Heart sounds: No murmur heard.   Pulmonary:     Effort: Pulmonary effort is normal. No respiratory distress.     Comments: Faint end expiratory wheeze noted bilaterally Abdominal:     Palpations: Abdomen is soft.     Tenderness: There is no abdominal tenderness.  Musculoskeletal:     Cervical back: Neck supple.  Skin:    General: Skin is warm and dry.  Neurological:     Mental Status: She is alert.     ED Results / Procedures / Treatments   Labs (all labs ordered are listed, but only abnormal results are displayed) Labs Reviewed  SARS CORONAVIRUS 2 (TAT 6-24 HRS)    EKG None  Radiology DG Chest Portable 1 View  Result Date: 07/16/2020 CLINICAL DATA:  Pt states cough and congestion x 1 week EXAM: PORTABLE CHEST - 1 VIEW COMPARISON:  04/16/2018 FINDINGS: Lungs are clear. Heart size and mediastinal contours are within normal limits. No effusion. Visualized bones unremarkable. IMPRESSION: No acute cardiopulmonary disease. Electronically Signed   By: 06/16/2018 M.D.   On: 07/16/2020 11:16    Procedures Procedures   Medications Ordered in ED Medications  albuterol (VENTOLIN HFA) 108 (90 Base) MCG/ACT inhaler 2 puff (2 puffs Inhalation Given 07/16/20 1036)    ED Course  I have reviewed the triage vital signs and the nursing notes.  Pertinent labs & imaging results that were available during my care of the patient were reviewed by me and considered in my medical decision making (see chart for details).    MDM Rules/Calculators/A&P                           35 year old lady presented to ER with concern for cough, congestion.  On exam she appears well in no distress.  Vital signs are stable.  No tachypnea or hypoxia.  Suspect viral upper respiratory illness.  Noted slight expiratory wheeze.  Provided inhaler.  Chest x-ray negative for pneumonia.  We will send COVID testing.  Reviewed return precautions, recommended supportive care for now and discharged home.   After the  discussed management above, the patient was determined to be safe for discharge.  The patient was in agreement with this plan and all questions regarding their care were answered.  ED return precautions were discussed and the patient will return to the ED with any significant worsening of condition.  Madison Davila was evaluated in Emergency Department on 07/16/2020 for  the symptoms described in the history of present illness. She was evaluated in the context of the global COVID-19 pandemic, which necessitated consideration that the patient might be at risk for infection with the SARS-CoV-2 virus that causes COVID-19. Institutional protocols and algorithms that pertain to the evaluation of patients at risk for COVID-19 are in a state of rapid change based on information released by regulatory bodies including the CDC and federal and state organizations. These policies and algorithms were followed during the patient's care in the ED.   Final Clinical Impression(s) / ED Diagnoses Final diagnoses:  Viral upper respiratory illness  Suspected COVID-19 virus infection    Rx / DC Orders ED Discharge Orders    None       Milagros Loll, MD 07/16/20 1202

## 2020-07-16 NOTE — Discharge Instructions (Signed)
Take Tylenol or Motrin for aches and pains.  Follow-up with primary doctor for recheck this coming week.  Take inhaler as needed for wheezing.  If you develop difficulty in breathing, chest pain, or other new concerning symptom, come back to ER for reassessment.  Until we have the results of your COVID test, recommend following isolation precautions.  Then follow CDC guidelines if you do test positive.

## 2020-07-16 NOTE — ED Triage Notes (Signed)
Pt reports cold like symptoms of cough, runny nose that started Saturday. Pt stated that she has a little SOB after taking a Hot shower, but currently denis SOB at this time.

## 2020-10-21 DIAGNOSIS — N898 Other specified noninflammatory disorders of vagina: Secondary | ICD-10-CM | POA: Diagnosis not present

## 2020-10-21 DIAGNOSIS — Z113 Encounter for screening for infections with a predominantly sexual mode of transmission: Secondary | ICD-10-CM | POA: Diagnosis not present

## 2021-03-24 DIAGNOSIS — F17210 Nicotine dependence, cigarettes, uncomplicated: Secondary | ICD-10-CM | POA: Diagnosis not present

## 2021-03-24 DIAGNOSIS — H109 Unspecified conjunctivitis: Secondary | ICD-10-CM | POA: Diagnosis not present

## 2021-03-24 DIAGNOSIS — J029 Acute pharyngitis, unspecified: Secondary | ICD-10-CM | POA: Diagnosis not present

## 2021-08-13 ENCOUNTER — Telehealth: Payer: BC Managed Care – PPO | Admitting: Nurse Practitioner

## 2021-08-13 DIAGNOSIS — R051 Acute cough: Secondary | ICD-10-CM | POA: Diagnosis not present

## 2021-08-13 MED ORDER — BENZONATATE 100 MG PO CAPS: 100.0000 mg | ORAL_CAPSULE | Freq: Two times a day (BID) | ORAL | 0 refills | Status: DC | PRN

## 2021-08-13 MED ORDER — AZITHROMYCIN 250 MG PO TABS: ORAL_TABLET | ORAL | 0 refills | Status: DC

## 2021-08-13 NOTE — Progress Notes (Signed)
We are sorry that you are not feeling well.  Here is how we plan to help! ? ?Based on your presentation I believe you most likely have A cough due to bacteria.  When patients have a fever and a productive cough with a change in color or increased sputum production, we are concerned about bacterial bronchitis.  If left untreated it can progress to pneumonia.  If your symptoms do not improve with your treatment plan it is important that you contact your provider.   I have prescribed Azithromyin 250 mg: two tablets now and then one tablet daily for 4 additonal days  ?  ?In addition you may use A prescription cough medication called Tessalon Perles 100mg. You may take 1-2 capsules every 8 hours as needed for your cough. ? ? ?From your responses in the eVisit questionnaire you describe inflammation in the upper respiratory tract which is causing a significant cough.  This is commonly called Bronchitis and has four common causes:   ?Allergies ?Viral Infections ?Acid Reflux ?Bacterial Infection ?Allergies, viruses and acid reflux are treated by controlling symptoms or eliminating the cause. An example might be a cough caused by taking certain blood pressure medications. You stop the cough by changing the medication. Another example might be a cough caused by acid reflux. Controlling the reflux helps control the cough. ? ?USE OF BRONCHODILATOR ("RESCUE") INHALERS: ?There is a risk from using your bronchodilator too frequently.  The risk is that over-reliance on a medication which only relaxes the muscles surrounding the breathing tubes can reduce the effectiveness of medications prescribed to reduce swelling and congestion of the tubes themselves.  Although you feel brief relief from the bronchodilator inhaler, your asthma may actually be worsening with the tubes becoming more swollen and filled with mucus.  This can delay other crucial treatments, such as oral steroid medications. If you need to use a bronchodilator  inhaler daily, several times per day, you should discuss this with your provider.  There are probably better treatments that could be used to keep your asthma under control.  ?   ?HOME CARE ?Only take medications as instructed by your medical team. ?Complete the entire course of an antibiotic. ?Drink plenty of fluids and get plenty of rest. ?Avoid close contacts especially the very young and the elderly ?Cover your mouth if you cough or cough into your sleeve. ?Always remember to wash your hands ?A steam or ultrasonic humidifier can help congestion.  ? ?GET HELP RIGHT AWAY IF: ?You develop worsening fever. ?You become short of breath ?You cough up blood. ?Your symptoms persist after you have completed your treatment plan ?MAKE SURE YOU  ?Understand these instructions. ?Will watch your condition. ?Will get help right away if you are not doing well or get worse. ?  ? ?Thank you for choosing an e-visit. ? ?Your e-visit answers were reviewed by a board certified advanced clinical practitioner to complete your personal care plan. Depending upon the condition, your plan could have included both over the counter or prescription medications. ? ?Please review your pharmacy choice. Make sure the pharmacy is open so you can pick up prescription now. If there is a problem, you may contact your provider through MyChart messaging and have the prescription routed to another pharmacy.  Your safety is important to us. If you have drug allergies check your prescription carefully.  ? ?For the next 24 hours you can use MyChart to ask questions about today's visit, request a non-urgent call back, or ask   for a work or school excuse. ?You will get an email in the next two days asking about your experience. I hope that your e-visit has been valuable and will speed your recovery. ? ?5-10 minutes spent reviewing and documenting in chart. ? ?

## 2021-11-02 DIAGNOSIS — M25361 Other instability, right knee: Secondary | ICD-10-CM | POA: Diagnosis not present

## 2021-11-02 DIAGNOSIS — F411 Generalized anxiety disorder: Secondary | ICD-10-CM | POA: Diagnosis not present

## 2021-11-02 DIAGNOSIS — M2352 Chronic instability of knee, left knee: Secondary | ICD-10-CM | POA: Diagnosis not present

## 2021-11-07 ENCOUNTER — Telehealth: Payer: BC Managed Care – PPO | Admitting: Physician Assistant

## 2021-11-07 DIAGNOSIS — N760 Acute vaginitis: Secondary | ICD-10-CM

## 2021-11-07 DIAGNOSIS — B9689 Other specified bacterial agents as the cause of diseases classified elsewhere: Secondary | ICD-10-CM

## 2021-11-07 MED ORDER — FLUCONAZOLE 150 MG PO TABS: ORAL_TABLET | ORAL | 0 refills | Status: DC

## 2021-11-07 MED ORDER — METRONIDAZOLE 500 MG PO TABS: 500.0000 mg | ORAL_TABLET | Freq: Two times a day (BID) | ORAL | 0 refills | Status: DC

## 2021-11-07 NOTE — Progress Notes (Signed)
I have spent 5 minutes in review of e-visit questionnaire, review and updating patient chart, medical decision making and response to patient.   Litisha Guagliardo Cody Tamieka Rancourt, PA-C    

## 2021-11-07 NOTE — Progress Notes (Signed)
E-Visit for Vaginal Symptoms  We are sorry that you are not feeling well. Here is how we plan to help! Based on what you shared with me it looks like you: May have a vaginosis due to bacteria  Vaginosis is an inflammation of the vagina that can result in discharge, itching and pain. The cause is usually a change in the normal balance of vaginal bacteria or an infection. Vaginosis can also result from reduced estrogen levels after menopause.  The most common causes of vaginosis are:   Bacterial vaginosis which results from an overgrowth of one on several organisms that are normally present in your vagina.   Yeast infections which are caused by a naturally occurring fungus called candida.   Vaginal atrophy (atrophic vaginosis) which results from the thinning of the vagina from reduced estrogen levels after menopause.   Trichomoniasis which is caused by a parasite and is commonly transmitted by sexual intercourse.  Factors that increase your risk of developing vaginosis include: Medications, such as antibiotics and steroids Uncontrolled diabetes Use of hygiene products such as bubble bath, vaginal spray or vaginal deodorant Douching Wearing damp or tight-fitting clothing Using an intrauterine device (IUD) for birth control Hormonal changes, such as those associated with pregnancy, birth control pills or menopause Sexual activity Having a sexually transmitted infection  Your treatment plan is Metronidazole or Flagyl 500mg  twice a day for 7 days.  I have electronically sent this prescription into the pharmacy that you have chosen. I have sent in Diflucan in case of an antibiotic-induced yeast infection.  Be sure to take all of the medication as directed. Stop taking any medication if you develop a rash, tongue swelling or shortness of breath. Mothers who are breast feeding should consider pumping and discarding their breast milk while on these antibiotics. However, there is no consensus that  infant exposure at these doses would be harmful.  Remember that medication creams can weaken latex condoms. Marland Kitchen   HOME CARE:  Good hygiene may prevent some types of vaginosis from recurring and may relieve some symptoms:  Avoid baths, hot tubs and whirlpool spas. Rinse soap from your outer genital area after a shower, and dry the area well to prevent irritation. Don't use scented or harsh soaps, such as those with deodorant or antibacterial action. Avoid irritants. These include scented tampons and pads. Wipe from front to back after using the toilet. Doing so avoids spreading fecal bacteria to your vagina.  Other things that may help prevent vaginosis include:  Don't douche. Your vagina doesn't require cleansing other than normal bathing. Repetitive douching disrupts the normal organisms that reside in the vagina and can actually increase your risk of vaginal infection. Douching won't clear up a vaginal infection. Use a latex condom. Both female and female latex condoms may help you avoid infections spread by sexual contact. Wear cotton underwear. Also wear pantyhose with a cotton crotch. If you feel comfortable without it, skip wearing underwear to bed. Yeast thrives in [employer] Your symptoms should improve in the next day or two.  GET HELP RIGHT AWAY IF:  You have pain in your lower abdomen ( pelvic area or over your ovaries) You develop nausea or vomiting You develop a fever Your discharge changes or worsens You have persistent pain with intercourse You develop shortness of breath, a rapid pulse, or you faint.  These symptoms could be signs of problems or infections that need to be evaluated by a medical provider now.  MAKE SURE YOU  Understand these instructions. Will watch your condition. Will get help right away if you are not doing well or get worse.  Thank you for choosing an e-visit.  Your e-visit answers were reviewed by a board certified advanced clinical  practitioner to complete your personal care plan. Depending upon the condition, your plan could have included both over the counter or prescription medications.  Please review your pharmacy choice. Make sure the pharmacy is open so you can pick up prescription now. If there is a problem, you may contact your provider through [employer] and have the prescription routed to another pharmacy.  Your safety is important to [place]. If you have drug allergies check your prescription carefully.   For the next 24 hours you can use MyChart to ask questions about today's visit, request a non-urgent call back, or ask for a work or school excuse. You will get an email in the next two days asking about your experience. I hope that your e-visit has been valuable and will speed your recovery.

## 2022-04-05 ENCOUNTER — Telehealth: Payer: Self-pay | Admitting: Physician Assistant

## 2022-04-05 DIAGNOSIS — N760 Acute vaginitis: Secondary | ICD-10-CM

## 2022-04-05 DIAGNOSIS — B9689 Other specified bacterial agents as the cause of diseases classified elsewhere: Secondary | ICD-10-CM

## 2022-04-05 MED ORDER — FLUCONAZOLE 150 MG PO TABS: 150.0000 mg | ORAL_TABLET | Freq: Once | ORAL | 0 refills | Status: AC

## 2022-04-05 MED ORDER — METRONIDAZOLE 500 MG PO TABS: 500.0000 mg | ORAL_TABLET | Freq: Two times a day (BID) | ORAL | 0 refills | Status: DC

## 2022-04-05 NOTE — Progress Notes (Signed)

## 2022-04-05 NOTE — Progress Notes (Signed)
Message sent to patient requesting further input regarding current symptoms. Awaiting patient response.  

## 2022-04-05 NOTE — Addendum Note (Signed)
Addended by: Brunetta Jeans on: 04/05/2022 05:45 PM   Modules accepted: Orders

## 2022-04-05 NOTE — Progress Notes (Signed)
I have spent 5 minutes in review of e-visit questionnaire, review and updating patient chart, medical decision making and response to patient.   [place] Stegemann Cody Yemariam Magar, PA-C    

## 2022-12-20 ENCOUNTER — Telehealth: Payer: Self-pay | Admitting: Physician Assistant

## 2022-12-20 DIAGNOSIS — B9689 Other specified bacterial agents as the cause of diseases classified elsewhere: Secondary | ICD-10-CM

## 2022-12-20 DIAGNOSIS — N760 Acute vaginitis: Secondary | ICD-10-CM

## 2022-12-20 MED ORDER — FLUCONAZOLE 150 MG PO TABS: ORAL_TABLET | ORAL | 0 refills | Status: DC

## 2022-12-20 MED ORDER — METRONIDAZOLE 500 MG PO TABS: 500.0000 mg | ORAL_TABLET | Freq: Two times a day (BID) | ORAL | 0 refills | Status: AC

## 2022-12-20 NOTE — Progress Notes (Signed)

## 2022-12-20 NOTE — Addendum Note (Signed)
Addended by: Waldon Merl on: 12/20/2022 01:51 PM   Modules accepted: Orders

## 2022-12-20 NOTE — Progress Notes (Signed)
I have spent 5 minutes in review of e-visit questionnaire, review and updating patient chart, medical decision making and response to patient.   Mia Milan Cody Jacklynn Dehaas, PA-C    

## 2023-04-01 ENCOUNTER — Other Ambulatory Visit: Payer: Self-pay

## 2023-04-01 ENCOUNTER — Ambulatory Visit
Admission: EM | Admit: 2023-04-01 | Discharge: 2023-04-01 | Disposition: A | Discharge status: Home / Self Care | Payer: No Typology Code available for payment source | Attending: Family Medicine | Admitting: Family Medicine

## 2023-04-01 DIAGNOSIS — R509 Fever, unspecified: Secondary | ICD-10-CM

## 2023-04-01 DIAGNOSIS — R6889 Other general symptoms and signs: Secondary | ICD-10-CM

## 2023-04-01 LAB — POC COVID19/FLU A&B COMBO
Covid Antigen, POC: NEGATIVE
Influenza A Antigen, POC: NEGATIVE
Influenza B Antigen, POC: NEGATIVE

## 2023-04-01 MED ORDER — OSELTAMIVIR PHOSPHATE 75 MG PO CAPS: 75.0000 mg | ORAL_CAPSULE | Freq: Two times a day (BID) | ORAL | 0 refills | Status: AC

## 2023-04-01 MED ORDER — IBUPROFEN 800 MG PO TABS
800.0000 mg | ORAL_TABLET | Freq: Once | ORAL | Status: AC
  Administered 2023-04-01: 800 mg via ORAL

## 2023-04-01 NOTE — ED Triage Notes (Signed)
Pt presents with complaints of flu-like symptoms x 2 days. Pt is currently reporting generalized body aches, fatigue, weakness, fevers, and chills. Pt states she is too weak to sit up, wants to lay down. Pt currently rates her overall pain a 7/10. Ibuprofen and cough drop taken with temporary relief.

## 2023-04-01 NOTE — ED Provider Notes (Signed)
[name]    CSN: [id number] Arrival date & time: 04/01/23  1705      History   Chief Complaint Chief Complaint  Patient presents with   Generalized Body Aches    HPI Madison Davila is a 38 y.o. female.   HPI Not feeling well for 2 days symptoms include body aches, fatigue, headache, fever, chills, cough.  Denies rhinorrhea, nasal congestion, chest pain or shortness of breath admits nausea.  Denies vomiting or diarrhea.  Son has the flu. No antipyretics taken today.  Past Medical History:  Diagnosis Date   Infection    UTI   Medical history non-contributory    No significant past medical history     Patient Active Problem List   Diagnosis Date Noted   Belly pain 02/28/2014   Vaginal delivery 02/20/2014   History of bilateral tubal ligation 02/20/2014   Morbid obesity (HCC) 02/12/2014   Positive GBS test 02/12/2014    Past Surgical History:  Procedure Laterality Date   NO PAST SURGERIES     TUBAL LIGATION Bilateral 02/19/2014   Procedure: POST PARTUM Bilateral Salpingectomies;  Surgeon: Konrad Felix, MD;  Location: [facility];  Service: Gynecology;  Laterality: Bilateral;    OB History     Gravida  2   Para  2   Term  2   Preterm  0   AB  0   Living  2      SAB  0   IAB  0   Ectopic  0   Multiple  0   Live Births  2            Home Medications    Prior to Admission medications   Medication Sig Start Date End Date Taking? Authorizing Provider  fluconazole (DIFLUCAN) 150 MG tablet Take 1 tablet PO once. Repeat in 3 days if needed. 12/20/22   Waldon Merl, PA-C  metroNIDAZOLE (FLAGYL) 500 MG tablet Take 1 tablet (500 mg total) by mouth 2 (two) times daily. 12/20/22   Waldon Merl, PA-C    Family History Family History  Problem Relation Age of Onset   Hypertension Mother    Hypertension Father    Cancer Paternal Grandmother     Social History Social History   Tobacco Use   Smoking status: Former     Current packs/day: 0.00    Types: Cigarettes    Quit date: 12/06/2015    Years since quitting: 7.3   Smokeless tobacco: Never   Tobacco comments:    Feb 2015  Vaping Use   Vaping status: Never Used  Substance Use Topics   Alcohol use: No    Comment: Occassional Use   Drug use: No     Allergies   Patient has no known allergies.   Review of Systems Review of Systems  Constitutional:  Positive for appetite change, chills, diaphoresis, fatigue and fever.  HENT:  Positive for sore throat. Negative for congestion and ear pain.   Respiratory:  Positive for cough.   Gastrointestinal:  Positive for nausea. Negative for diarrhea and vomiting.  Musculoskeletal:  Positive for myalgias. Negative for arthralgias.  Neurological:  Positive for headaches.     Physical Exam Triage Vital Signs ED Triage Vitals  Encounter Vitals Group     BP 04/01/23 1800 122/78     Systolic BP Percentile --      Diastolic BP Percentile --      Pulse Rate 04/01/23 1800 (!) 105  Resp 04/01/23 1800 18     Temp 04/01/23 1800 (!) 102.7 F (39.3 C)     Temp Source 04/01/23 1800 Oral     SpO2 04/01/23 1800 95 %     Weight 04/01/23 1759 265 lb (120.2 kg)     Height 04/01/23 1759 5\' 5"  (1.651 m)     Head Circumference --      Peak Flow --      Pain Score 04/01/23 1759 7     Pain Loc --      Pain Education --      Exclude from Growth Chart --    No data found.  Updated Vital Signs BP 122/78 (BP Location: Right Arm)   Pulse (!) 105   Temp (!) 102.7 F (39.3 C) (Oral)   Resp 18   Ht 5\' 5"  (1.651 m)   Wt 265 lb (120.2 kg)   LMP 03/27/2023 (Exact Date)   SpO2 95%   BMI 44.10 kg/m   Visual Acuity Right Eye Distance:   Left Eye Distance:   Bilateral Distance:    Right Eye Near:   Left Eye Near:    Bilateral Near:     Physical Exam Vitals and nursing note reviewed.  Constitutional:      Comments: Appears to not feel well  HENT:     Head: Normocephalic and atraumatic.     Right Ear:  Tympanic membrane and ear canal normal.     Left Ear: Tympanic membrane and ear canal normal.     Nose: No rhinorrhea.     Mouth/Throat:     Mouth: Mucous membranes are moist.     Pharynx: Oropharynx is clear. No oropharyngeal exudate or posterior oropharyngeal erythema.  Eyes:     General:        Right eye: No discharge.        Left eye: No discharge.  Cardiovascular:     Rate and Rhythm: Tachycardia present.     Heart sounds: Normal heart sounds.  Pulmonary:     Effort: Pulmonary effort is normal. No respiratory distress.     Breath sounds: Normal breath sounds. No wheezing or rales.  Musculoskeletal:     Cervical back: Neck supple.  Lymphadenopathy:     Cervical: No cervical adenopathy.  Skin:    Comments: Warm to the touch  Neurological:     Mental Status: She is alert and oriented to person, place, and time.  Psychiatric:        Mood and Affect: Mood normal.      UC Treatments / Results  Labs (all labs ordered are listed, but only abnormal results are displayed) Labs Reviewed  POC COVID19/FLU A&B COMBO    EKG   Radiology No results found.  Procedures Procedures (including critical care time)  Medications Ordered in UC Medications  ibuprofen (ADVIL) tablet 800 mg (has no administration in time range)    Initial Impression / Assessment and Plan / UC Course  I have reviewed the triage vital signs and the nursing notes.  Pertinent labs & imaging results that were available during my care of the patient were reviewed by me and considered in my medical decision making (see chart for details).     38 year old exposed to flu with symptoms for 2 days has fever to 102.7, mildly tachycardic and appears to not feel well Care COVID-negative point-of-care flu negative suspect this is a false negative.  Rx Tamiflu to see home care and follow-up reviewed with patient  Final Clinical Impressions(s) / UC Diagnoses   Final diagnoses:  Fever, unspecified   Discharge  Instructions   None    ED Prescriptions   None    PDMP not reviewed this encounter.   Meliton Rattan, [place] 04/01/23 1836

## 2023-12-17 ENCOUNTER — Telehealth: Payer: Self-pay | Admitting: Family Medicine

## 2023-12-17 DIAGNOSIS — B3731 Acute candidiasis of vulva and vagina: Secondary | ICD-10-CM

## 2023-12-17 MED ORDER — FLUCONAZOLE 150 MG PO TABS: 150.0000 mg | ORAL_TABLET | ORAL | 0 refills | Status: AC

## 2023-12-17 NOTE — Progress Notes (Signed)
